# Patient Record
Sex: Male | Born: 1965 | Race: White | Hispanic: No | Marital: Single
Health system: Midwestern US, Community
[De-identification: ages and names within clinical notes are randomized; demographics above are authoritative.]

## PROBLEM LIST (undated history)

## (undated) DIAGNOSIS — G8918 Other acute postprocedural pain: Principal | ICD-10-CM

## (undated) DIAGNOSIS — T84018A Broken internal joint prosthesis, other site, initial encounter: Secondary | ICD-10-CM

## (undated) DIAGNOSIS — E119 Type 2 diabetes mellitus without complications: Secondary | ICD-10-CM

## (undated) DIAGNOSIS — G4730 Sleep apnea, unspecified: Secondary | ICD-10-CM

## (undated) DIAGNOSIS — F319 Bipolar disorder, unspecified: Secondary | ICD-10-CM

## (undated) DIAGNOSIS — G5600 Carpal tunnel syndrome, unspecified upper limb: Secondary | ICD-10-CM

## (undated) HISTORY — DX: Sleep apnea, unspecified: G47.30

## (undated) HISTORY — DX: Bipolar disorder, unspecified: F31.9

## (undated) HISTORY — DX: Carpal tunnel syndrome, unspecified upper limb: G56.00

## (undated) HISTORY — DX: Type 2 diabetes mellitus without complications: E11.9

---
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
## 2004-01-01 HISTORY — PX: SEPTOPLASTY: SUR1290

## 2011-06-29 DIAGNOSIS — F319 Bipolar disorder, unspecified: Secondary | ICD-10-CM | POA: Insufficient documentation

## 2012-09-09 DIAGNOSIS — E785 Hyperlipidemia, unspecified: Secondary | ICD-10-CM | POA: Insufficient documentation

## 2012-12-11 DIAGNOSIS — G8929 Other chronic pain: Secondary | ICD-10-CM

## 2012-12-11 DIAGNOSIS — M549 Dorsalgia, unspecified: Secondary | ICD-10-CM | POA: Insufficient documentation

## 2013-08-18 DIAGNOSIS — Z87442 Personal history of urinary calculi: Secondary | ICD-10-CM | POA: Insufficient documentation

## 2014-11-11 DIAGNOSIS — M47812 Spondylosis without myelopathy or radiculopathy, cervical region: Secondary | ICD-10-CM | POA: Insufficient documentation

## 2014-11-17 DIAGNOSIS — R768 Other specified abnormal immunological findings in serum: Secondary | ICD-10-CM | POA: Insufficient documentation

## 2014-11-30 DIAGNOSIS — M069 Rheumatoid arthritis, unspecified: Secondary | ICD-10-CM | POA: Insufficient documentation

## 2015-08-31 ENCOUNTER — Emergency Department (INDEPENDENT_AMBULATORY_CARE_PROVIDER_SITE_OTHER)
Admission: EM | Admit: 2015-08-31 | Discharge: 2015-08-31 | Disposition: A | Discharge status: Other Acute Inpatient Hospital | Payer: Medicare Other | Source: Home / Self Care | Attending: Family Medicine | Admitting: Family Medicine

## 2015-08-31 ENCOUNTER — Encounter (HOSPITAL_COMMUNITY): Payer: Self-pay | Admitting: *Deleted

## 2015-08-31 ENCOUNTER — Emergency Department (HOSPITAL_COMMUNITY)
Admission: EM | Admit: 2015-08-31 | Discharge: 2015-08-31 | Disposition: A | Discharge status: Home / Self Care | Payer: Medicare Other | Attending: Emergency Medicine | Admitting: Emergency Medicine

## 2015-08-31 ENCOUNTER — Emergency Department (HOSPITAL_COMMUNITY): Payer: Medicare Other

## 2015-08-31 DIAGNOSIS — R8299 Other abnormal findings in urine: Secondary | ICD-10-CM | POA: Diagnosis not present

## 2015-08-31 DIAGNOSIS — J029 Acute pharyngitis, unspecified: Secondary | ICD-10-CM | POA: Insufficient documentation

## 2015-08-31 DIAGNOSIS — R82998 Other abnormal findings in urine: Secondary | ICD-10-CM

## 2015-08-31 LAB — DIFFERENTIAL
Basophils Absolute: 0 10*3/uL (ref 0.0–0.1)
Basophils Relative: 0 % (ref 0–1)
EOS ABS: 0 10*3/uL (ref 0.0–0.7)
EOS PCT: 1 % (ref 0–5)
LYMPHS ABS: 1.6 10*3/uL (ref 0.7–4.0)
LYMPHS PCT: 27 % (ref 12–46)
MONOS PCT: 13 % — AB (ref 3–12)
Monocytes Absolute: 0.8 10*3/uL (ref 0.1–1.0)
Neutro Abs: 3.4 10*3/uL (ref 1.7–7.7)
Neutrophils Relative %: 59 % (ref 43–77)

## 2015-08-31 LAB — COMPREHENSIVE METABOLIC PANEL
ALBUMIN: 4.1 g/dL (ref 3.5–5.0)
ALT: 31 U/L (ref 17–63)
AST: 26 U/L (ref 15–41)
Alkaline Phosphatase: 67 U/L (ref 38–126)
Anion gap: 6 (ref 5–15)
BUN: 6 mg/dL (ref 6–20)
CHLORIDE: 105 mmol/L (ref 101–111)
CO2: 29 mmol/L (ref 22–32)
CREATININE: 0.75 mg/dL (ref 0.61–1.24)
Calcium: 9.1 mg/dL (ref 8.9–10.3)
GFR calc Af Amer: >60 mL/min (ref >60)
GLUCOSE: 94 mg/dL (ref 65–99)
POTASSIUM: 3.5 mmol/L (ref 3.5–5.1)
Sodium: 140 mmol/L (ref 135–145)
Total Bilirubin: 1 mg/dL (ref 0.3–1.2)
Total Protein: 7.3 g/dL (ref 6.5–8.1)

## 2015-08-31 LAB — POCT URINALYSIS DIP (DEVICE)
Glucose, UA: 100 mg/dL — AB
Hgb urine dipstick: NEGATIVE
KETONES UR: 15 mg/dL — AB
LEUKOCYTES UA: NEGATIVE
Nitrite: NEGATIVE
PH: 6 (ref 5.0–8.0)
Protein, ur: 30 mg/dL — AB
Specific Gravity, Urine: 1.025 (ref 1.005–1.030)
Urobilinogen, UA: 4 mg/dL — ABNORMAL HIGH (ref 0.0–1.0)

## 2015-08-31 LAB — URINALYSIS, ROUTINE W REFLEX MICROSCOPIC
BILIRUBIN URINE: NEGATIVE
GLUCOSE, UA: NEGATIVE mg/dL
Hgb urine dipstick: NEGATIVE
KETONES UR: NEGATIVE mg/dL
Leukocytes, UA: NEGATIVE
Nitrite: NEGATIVE
PH: 6 (ref 5.0–8.0)
Protein, ur: NEGATIVE mg/dL
SPECIFIC GRAVITY, URINE: 1.005 (ref 1.005–1.030)
Urobilinogen, UA: 1 mg/dL (ref 0.0–1.0)

## 2015-08-31 LAB — RAPID STREP SCREEN (MED CTR MEBANE ONLY): Streptococcus, Group A Screen (Direct): NEGATIVE

## 2015-08-31 LAB — CBC
HEMATOCRIT: 36.3 % — AB (ref 39.0–52.0)
Hemoglobin: 12.8 g/dL — ABNORMAL LOW (ref 13.0–17.0)
MCH: 32.9 pg (ref 26.0–34.0)
MCHC: 35.3 g/dL (ref 30.0–36.0)
MCV: 93.3 fL (ref 78.0–100.0)
PLATELETS: 170 10*3/uL (ref 150–400)
RBC: 3.89 MIL/uL — ABNORMAL LOW (ref 4.22–5.81)
RDW: 13.6 % (ref 11.5–15.5)
WBC: 6.2 10*3/uL (ref 4.0–10.5)

## 2015-08-31 LAB — LIPASE, BLOOD: LIPASE: 22 U/L (ref 22–51)

## 2015-08-31 NOTE — ED Notes (Signed)
Pt reports sore throat for several day. Pt went to [facility] and was sent here for eval of jaundice. Pt noted to have yellow tongue. Pt reports feeling tired and having dark colored urine.

## 2015-08-31 NOTE — Discharge Instructions (Signed)

## 2015-08-31 NOTE — ED Provider Notes (Signed)
CSN: [id number]     Arrival date & time 08/31/15  1507 History   First MD Initiated Contact with Patient 08/31/15 1523     Chief Complaint  Patient presents with  . Sore Throat   (Consider location/radiation/quality/duration/timing/severity/associated sxs/prior Treatment) Patient is a 49 y.o. male presenting with pharyngitis. The history is provided by the patient.  Sore Throat This is a new problem. The current episode started more than 2 days ago. The problem has been gradually worsening. Pertinent negatives include no chest pain and no abdominal pain.    History reviewed. No pertinent past medical history. History reviewed. No pertinent past surgical history. History reviewed. No pertinent family history. Social History  Substance Use Topics  . Smoking status: Never Smoker   . Smokeless tobacco: None  . Alcohol Use: Yes    Review of Systems  Constitutional: Positive for fever.  HENT: Positive for congestion, postnasal drip, rhinorrhea and sore throat.   Respiratory: Negative.   Cardiovascular: Negative.  Negative for chest pain.  Gastrointestinal: Negative.  Negative for abdominal pain.  Genitourinary: Positive for hematuria.    Allergies  Tegretol  Home Medications   Prior to Admission medications   Not on File   Meds Ordered and Administered this Visit  Medications - No data to display  There were no vitals taken for this visit. No data found.   Physical Exam  Constitutional: He is oriented to person, place, and time. He appears well-developed and well-nourished. No distress.  HENT:  Right Ear: External ear normal.  Left Ear: External ear normal.  Mouth/Throat: Oropharynx is clear and moist.  Eyes: Conjunctivae are normal. Pupils are equal, round, and reactive to light.  Neck: Normal range of motion. Neck supple.  Cardiovascular: Normal heart sounds.   Pulmonary/Chest: Effort normal and breath sounds normal.  Abdominal: Soft. Bowel sounds are normal. He  exhibits no mass. There is no tenderness.  Lymphadenopathy:    He has no cervical adenopathy.  Neurological: He is alert and oriented to person, place, and time.  Skin: Skin is warm and dry.  Nursing note and vitals reviewed.   ED Course  Procedures (including critical care time)  Labs Review Labs Reviewed  POCT URINALYSIS DIP (DEVICE) - Abnormal; Notable for the following:    Glucose, UA 100 (*)    Bilirubin Urine SMALL (*)    Ketones, ur 15 (*)    Protein, ur 30 (*)    Urobilinogen, UA 4.0 (*)    All other components within normal limits   U/a abnl. Imaging Review No results found.   Visual Acuity Review  Right Eye Distance:   Left Eye Distance:   Bilateral Distance:    Right Eye Near:   Left Eye Near:    Bilateral Near:         MDM   1. Dark urine    Sent for eval of abnl u/a.   Linna Hoff, MD 08/31/15 [phone]

## 2015-08-31 NOTE — ED Notes (Signed)
Pt  Reports  Symptoms  Of  sorethroat    X  4  Days  With  Dark  Urine      Pt  Reports          Decreased  Appetite   And         Body  Aches

## 2015-08-31 NOTE — ED Provider Notes (Signed)
5:36 PM BP 145/78 mmHg  Pulse 77  Temp(Src) 98.5 F (36.9 C) (Oral)  Resp 16  Ht 5\' 8"  (1.727 m)  Wt 254 lb (115.214 kg)  BMI 38.63 kg/m2  SpO2 98%  S- patient sent from urgent care for sxs of uri/dark urine this morning. C/o sore throat, decreased hearing. Cough for several days. Subjective fevers at home. No other meds including topical anesthetics.   O- well appearing male in nad HEENT- atraumatic Ears:\ R TM with retraction, no =erythema or tenderness, Left tm normal Mouth:-coated tongue with yellow discoloration (just drank fruit juice) single vesicle proximal to the uvula, no exudate. BL cervical lymphadenopathy, no cyanosis Eyes: no scleral icterus Heart: RRR, no mgr Chest: ctab Skin: warm and dry, no jaundice  A: likely viral uri, UA pos for urobilinogen, no signs of jaundice or cyanosis indicating methemoglobinemia P: continue with work up, no other LABS INDICATED    , PA-C 08/31/15 1749  09/02/15, MD 09/02/15 11/02/15

## 2015-08-31 NOTE — ED Notes (Signed)
Patient transported to X-ray 

## 2015-08-31 NOTE — ED Provider Notes (Signed)
CSN: [id number]     Arrival date & time 08/31/15  1714 History   First MD Initiated Contact with Patient 08/31/15 1932     Chief Complaint  Patient presents with  . Sore Throat  . Jaundice     (Consider location/radiation/quality/duration/timing/severity/associated sxs/prior Treatment) Patient is a 49 y.o. male presenting with URI.  URI Presenting symptoms: congestion, rhinorrhea and sore throat   Presenting symptoms: no cough, no ear pain, no facial pain and no fever   Severity:  Severe Onset quality:  Gradual Duration:  5 days Timing:  Constant Progression:  Worsening Chronicity:  New Relieved by:  None tried Worsened by:  Drinking Ineffective treatments:  None tried Associated symptoms: swollen glands   Associated symptoms: no arthralgias, no headaches, no myalgias, no neck pain and no wheezing   Risk factors: no immunosuppression and no sick contacts     History reviewed. No pertinent past medical history. Past Surgical History  Procedure Laterality Date  . Tonsillectomy     No family history on file. Social History  Substance Use Topics  . Smoking status: Never Smoker   . Smokeless tobacco: None  . Alcohol Use: Yes    Review of Systems  Constitutional: Negative for fever and chills.  HENT: Positive for congestion, rhinorrhea, sinus pressure and sore throat. Negative for ear pain.   Eyes: Negative for visual disturbance.  Respiratory: Negative for cough, shortness of breath and wheezing.   Cardiovascular: Negative for chest pain.  Gastrointestinal: Negative for nausea, vomiting, abdominal pain, diarrhea and constipation.  Genitourinary: Negative for dysuria and difficulty urinating.  Musculoskeletal: Negative for myalgias, arthralgias and neck pain.  Skin: Negative for wound.  Neurological: Negative for syncope and headaches.  Psychiatric/Behavioral: Negative for behavioral problems.  All other systems reviewed and are negative.     Allergies   Tegretol  Home Medications   Prior to Admission medications   Not on File   BP 145/78 mmHg  Pulse 77  Temp(Src) 98.5 F (36.9 C) (Oral)  Resp 16  Ht 5\' 8"  (1.727 m)  Wt 254 lb (115.214 kg)  BMI 38.63 kg/m2  SpO2 98% Physical Exam  Constitutional: He is oriented to person, place, and time. He appears well-developed and well-nourished.  HENT:  Head: Normocephalic and atraumatic.  Mouth/Throat: Oral lesions present. No uvula swelling. Posterior oropharyngeal erythema present. No oropharyngeal exudate or tonsillar abscesses.    Eyes: EOM are normal.  Neck: Normal range of motion.  Cardiovascular: Normal rate, regular rhythm and normal heart sounds.   No murmur heard. Pulmonary/Chest: Effort normal and breath sounds normal. No respiratory distress.  Abdominal: Soft. There is no tenderness.  Musculoskeletal: He exhibits no edema.  Neurological: He is alert and oriented to person, place, and time.  Skin: No rash noted. He is not diaphoretic.    ED Course  Procedures (including critical care time) Labs Review Labs Reviewed  CBC - Abnormal; Notable for the following:    RBC 3.89 (*)    Hemoglobin 12.8 (*)    HCT 36.3 (*)    All other components within normal limits  DIFFERENTIAL - Abnormal; Notable for the following:    Monocytes Relative 13 (*)    All other components within normal limits  RAPID STREP SCREEN (NOT AT [facility])  CULTURE, GROUP A STREP  LIPASE, BLOOD  COMPREHENSIVE METABOLIC PANEL  URINALYSIS, ROUTINE W REFLEX MICROSCOPIC (NOT AT [facility])  RPR  CBC WITH DIFFERENTIAL/PLATELET  HIV ANTIBODY (ROUTINE TESTING)  GC/CHLAMYDIA PROBE AMP ([place]) NOT  AT [facility]    Imaging Review Dg Neck Soft Tissue  08/31/2015   CLINICAL DATA:  Dysphagia, 2 days duration.  EXAM: NECK SOFT TISSUES - 1+ VIEW  COMPARISON:  None.  FINDINGS: There is no evidence of retropharyngeal soft tissue swelling or epiglottic enlargement. The cervical airway is unremarkable and no radio-opaque  foreign body identified.  IMPRESSION: Negative.   Electronically Signed   By: Ellery Plunk M.D.   On: 08/31/2015 21:11   I have personally reviewed and evaluated these images and lab results as part of my medical decision-making.   EKG Interpretation None      MDM   Final diagnoses:  Pharyngitis     Patient is a 49 year old male with a history of diabetes that presents with a fibrinous sore throat. Patient was seen in urgent care and there was also concern for jaundice on physical exam however patient denies any right upper quadrant pain. On arrival to the ED today patient is afebrile with stable vital signs. Patient states he has felt hot but has not had any objective fevers. On exam patient has a small ulceration to the soft palate and an erythematous posterior oropharynx. Patient had a tonsillectomy. Uvula is midline. Patient is homosexual and his partner is HIV positive however the patient does not know his status at this time. Patient wishes to be tested. Screening labs were sent and patient has normal LFTs with no elevated bilirubin. Patient's urine was within normal limits. We will send a throat culture, strep screen, GC chlamydia, RPR, HIV test. Patient is able to handle oral secretions and has no airway difficult. Patient does admit to receptive oral sex. Patient has no tenderness in his right upper quadrant.  Patient's strep screen is negative. Patient's soft tissue neck is negative. We'll have the patient follow-up as an outpatient and return precautions given.  Beverely Risen, MD 08/31/15 2150  Richardean Canal, MD 09/01/15 2034

## 2015-09-01 LAB — GC/CHLAMYDIA PROBE AMP (~~LOC~~) NOT AT ARMC
Chlamydia: NEGATIVE
Neisseria Gonorrhea: NEGATIVE

## 2015-09-01 LAB — HIV ANTIBODY (ROUTINE TESTING W REFLEX): HIV SCREEN 4TH GENERATION: NONREACTIVE

## 2015-09-01 LAB — RPR: RPR: NONREACTIVE

## 2015-09-02 LAB — CULTURE, GROUP A STREP: Strep A Culture: NEGATIVE

## 2015-09-09 ENCOUNTER — Emergency Department (INDEPENDENT_AMBULATORY_CARE_PROVIDER_SITE_OTHER)
Admission: EM | Admit: 2015-09-09 | Discharge: 2015-09-09 | Disposition: A | Discharge status: Home / Self Care | Payer: Medicare Other | Source: Home / Self Care | Attending: Emergency Medicine | Admitting: Emergency Medicine

## 2015-09-09 ENCOUNTER — Encounter (HOSPITAL_COMMUNITY): Payer: Self-pay | Admitting: Emergency Medicine

## 2015-09-09 DIAGNOSIS — J0140 Acute pansinusitis, unspecified: Secondary | ICD-10-CM

## 2015-09-09 MED ORDER — AZITHROMYCIN 250 MG PO TABS: ORAL_TABLET | ORAL | Status: DC

## 2015-09-09 MED ORDER — PREDNISONE 50 MG PO TABS: ORAL_TABLET | ORAL | Status: DC

## 2015-09-09 NOTE — ED Notes (Addendum)
Patient reports sore throat continues since being seen 8/31.  Seen at [facility] and then transferred to [facility] emergency department.  Patient did have a negative strep when worked up in emergency department on 8/31.  Patient states he is convinced he has a sinus infection: c/o cough, stuffy in head, right ear pain, stuffy hearing

## 2015-09-09 NOTE — ED Provider Notes (Signed)
CSN: [id number]     Arrival date & time 09/09/15  1906 History   First MD Initiated Contact with Patient 09/09/15 1943     Chief Complaint  Patient presents with  . Sore Throat   (Consider location/radiation/quality/duration/timing/severity/associated sxs/prior Treatment) HPI  He is a 49 year old man here for evaluation of sore throat. He was seen here for this on August 31. At that time he had a negative strep test. He was transferred to the emergency room for urine abnormalities. He states his symptoms have been going on for 2-1/2 weeks now he reports nasal congestion, sinus pressure, postnasal drip, sore throat, and cough. He denies any fevers. He does report ear pain, worse on the right. He states this feels like prior sinus infections he has had.  History reviewed. No pertinent past medical history. Past Surgical History  Procedure Laterality Date  . Tonsillectomy    . Nasal septum surgery     No family history on file. Social History  Substance Use Topics  . Smoking status: Never Smoker   . Smokeless tobacco: None  . Alcohol Use: Yes    Review of Systems As in history of present illness Allergies  Tegretol  Home Medications   Prior to Admission medications   Medication Sig Start Date End Date Taking? Authorizing Provider  azithromycin (ZITHROMAX Z-PAK) 250 MG tablet Take 2 pills today, then 1 pill daily until gone. 09/09/15   Charm Rings, MD  predniSONE (DELTASONE) 50 MG tablet Take 1 pill daily for 5 days. 09/09/15   Charm Rings, MD   Meds Ordered and Administered this Visit  Medications - No data to display  BP 159/71 mmHg  Pulse 74  Temp(Src) 98.3 F (36.8 C) (Oral)  Resp 17  SpO2 100% No data found.   Physical Exam  Constitutional: He is oriented to person, place, and time. He appears well-developed and well-nourished. No distress.  HENT:  Mouth/Throat: No oropharyngeal exudate.  Nasal discharge present. He has a healing ulcer on the roof of his mouth.  Right TM is mildly retracted.  Eyes: Conjunctivae are normal.  Neck: Neck supple.  Cardiovascular: Normal rate, regular rhythm and normal heart sounds.   No murmur heard. Pulmonary/Chest: Effort normal and breath sounds normal. No respiratory distress. He has no wheezes. He has no rales.  Lymphadenopathy:    He has no cervical adenopathy.  Neurological: He is alert and oriented to person, place, and time.    ED Course  Procedures (including critical care time)  Labs Review Labs Reviewed - No data to display  Imaging Review No results found.   MDM   1. Acute pansinusitis, recurrence not specified    Treatment with azithromycin and prednisone. He will continue his allergy medications. Follow-up as needed.     Charm Rings, MD 09/09/15 2018

## 2015-09-09 NOTE — Discharge Instructions (Signed)
You have a sinus infection. Please continue your allergy medicines. Take azithromycin and prednisone as prescribed. Follow-up as needed.

## 2015-10-24 ENCOUNTER — Encounter: Payer: Self-pay | Admitting: Infectious Diseases

## 2015-10-24 ENCOUNTER — Ambulatory Visit (INDEPENDENT_AMBULATORY_CARE_PROVIDER_SITE_OTHER): Payer: Medicare Other | Admitting: Infectious Diseases

## 2015-10-24 ENCOUNTER — Other Ambulatory Visit (HOSPITAL_COMMUNITY)
Admission: RE | Admit: 2015-10-24 | Discharge: 2015-10-24 | Disposition: A | Discharge status: Home / Self Care | Payer: Medicare Other | Source: Ambulatory Visit | Attending: Infectious Diseases | Admitting: Infectious Diseases

## 2015-10-24 VITALS — BP 144/94 | HR 69 | Temp 98.0°F | Ht 68.0 in | Wt 244.0 lb

## 2015-10-24 DIAGNOSIS — Z7253 High risk bisexual behavior: Secondary | ICD-10-CM

## 2015-10-24 DIAGNOSIS — Z113 Encounter for screening for infections with a predominantly sexual mode of transmission: Secondary | ICD-10-CM | POA: Diagnosis present

## 2015-10-24 DIAGNOSIS — Z7252 High risk homosexual behavior: Secondary | ICD-10-CM | POA: Diagnosis not present

## 2015-10-24 DIAGNOSIS — Z206 Contact with and (suspected) exposure to human immunodeficiency virus [HIV]: Secondary | ICD-10-CM | POA: Diagnosis not present

## 2015-10-24 MED ORDER — EMTRICITABINE-TENOFOVIR DF 200-300 MG PO TABS: 1.0000 | ORAL_TABLET | Freq: Every day | ORAL | Status: DC

## 2015-10-24 NOTE — Assessment & Plan Note (Addendum)
I spoke with him about starting PrEP, he would like to start now. I asked if he was interested in clinical trial and he wishes to start on medicine sooner, rather than later. I also discussed his case with PrEP trial team.  Will check his labs today (STD, HIV RNA) and see him back in 3 month to repeat.  He will given a rx to start after his labs return.  He has gotten flu shot.  Consider hep A at f/u if negative.

## 2015-10-24 NOTE — Progress Notes (Signed)
   Subjective:    Patient ID: Julian Hull, male    DOB: Jul 25, 1966, 49 y.o.   MRN: [id number]  HPI 49 yo M with hx of bipolar, carpal tunnel in L hand, had surgery ~ 3 months ago. Also has difficulty with his R hand.  Here for eval for PrEP.   Has had 4 partners in last 6 months. No hx of STIs.   PMhx/SOC/FHx reviewed, updated in EPIC.   Is trying to eat less,   Review of Systems  Constitutional: Negative for fever, chills, appetite change and unexpected weight change.  Respiratory: Negative for cough and shortness of breath.   Cardiovascular: Negative for chest pain.  Gastrointestinal: Negative for diarrhea and constipation.  Genitourinary: Negative for difficulty urinating.  Psychiatric/Behavioral: Negative for dysphoric mood.  FSG was 144 last PM  Please see HPI. 12 point ROS o/w (-)     Objective:   Physical Exam  Constitutional: He appears well-developed and well-nourished.  HENT:  Mouth/Throat: No oropharyngeal exudate.  Eyes: EOM are normal. Pupils are equal, round, and reactive to light.  Neck: Neck supple.  Cardiovascular: Normal rate, regular rhythm and normal heart sounds.   Pulmonary/Chest: Effort normal and breath sounds normal.  Abdominal: Soft. Bowel sounds are normal. There is no tenderness.  Musculoskeletal: He exhibits no edema.  Lymphadenopathy:    He has no cervical adenopathy.      Assessment & Plan:

## 2015-10-25 LAB — COMPREHENSIVE METABOLIC PANEL

## 2015-10-25 LAB — HEPATITIS B SURFACE ANTIBODY,QUALITATIVE

## 2015-10-25 LAB — RPR

## 2015-10-25 LAB — URINE CYTOLOGY ANCILLARY ONLY
Chlamydia: NEGATIVE
Neisseria Gonorrhea: NEGATIVE

## 2015-10-25 LAB — HIV ANTIBODY (ROUTINE TESTING W REFLEX)

## 2015-10-25 LAB — CBC

## 2015-10-25 LAB — HEPATITIS C ANTIBODY

## 2015-10-25 LAB — HEPATITIS A ANTIBODY, TOTAL

## 2015-10-25 LAB — HEPATITIS B SURFACE ANTIGEN

## 2015-10-26 ENCOUNTER — Other Ambulatory Visit: Payer: Medicare Other

## 2015-10-26 LAB — CBC
HCT: 40.1 % (ref 39.0–52.0)
Hemoglobin: 14.5 g/dL (ref 13.0–17.0)
MCH: 33.7 pg (ref 26.0–34.0)
MCHC: 36.2 g/dL — ABNORMAL HIGH (ref 30.0–36.0)
MCV: 93.3 fL (ref 78.0–100.0)
MPV: 9.8 fL (ref 8.6–12.4)
PLATELETS: 192 10*3/uL (ref 150–400)
RBC: 4.3 MIL/uL (ref 4.22–5.81)
RDW: 14.5 % (ref 11.5–15.5)
WBC: 6.6 10*3/uL (ref 4.0–10.5)

## 2015-10-26 LAB — COMPREHENSIVE METABOLIC PANEL WITH GFR
ALT: 40 U/L (ref 9–46)
AST: 22 U/L (ref 10–40)
Albumin: 4.2 g/dL (ref 3.6–5.1)
Alkaline Phosphatase: 81 U/L (ref 40–115)
BUN: 11 mg/dL (ref 7–25)
CO2: 28 mmol/L (ref 20–31)
Calcium: 9.5 mg/dL (ref 8.6–10.3)
Chloride: 102 mmol/L (ref 98–110)
Creat: 0.65 mg/dL (ref 0.60–1.35)
Glucose, Bld: 110 mg/dL — ABNORMAL HIGH (ref 65–99)
Potassium: 4.1 mmol/L (ref 3.5–5.3)
Sodium: 141 mmol/L (ref 135–146)
Total Bilirubin: 0.6 mg/dL (ref 0.2–1.2)
Total Protein: 6.9 g/dL (ref 6.1–8.1)

## 2015-10-26 LAB — HIV-1 RNA QUANT-NO REFLEX-BLD: HIV-1 RNA Quant, Log: <1.3 Log copies/mL (ref <1.30)

## 2015-10-26 NOTE — Addendum Note (Signed)
Addended by: Andree Coss on: 10/26/2015 04:44 PM   Modules accepted: Orders

## 2015-10-27 LAB — HEPATITIS B SURFACE ANTIBODY,QUALITATIVE: Hep B S Ab: POSITIVE — AB

## 2015-10-27 LAB — HIV ANTIBODY (ROUTINE TESTING W REFLEX): HIV: NONREACTIVE

## 2015-10-27 LAB — HEPATITIS B SURFACE ANTIGEN: HEP B S AG: NEGATIVE

## 2015-10-27 LAB — HEPATITIS C ANTIBODY: HCV Ab: NEGATIVE

## 2015-10-27 LAB — RPR

## 2015-10-27 LAB — HEPATITIS A ANTIBODY, TOTAL: Hep A Total Ab: NONREACTIVE

## 2015-10-28 ENCOUNTER — Other Ambulatory Visit: Payer: Self-pay | Admitting: *Deleted

## 2015-10-28 DIAGNOSIS — Z206 Contact with and (suspected) exposure to human immunodeficiency virus [HIV]: Secondary | ICD-10-CM

## 2015-10-28 MED ORDER — EMTRICITABINE-TENOFOVIR DF 200-300 MG PO TABS: 1.0000 | ORAL_TABLET | Freq: Every day | ORAL | Status: AC

## 2015-10-31 ENCOUNTER — Ambulatory Visit (INDEPENDENT_AMBULATORY_CARE_PROVIDER_SITE_OTHER): Payer: Medicare Other | Admitting: Family Medicine

## 2015-10-31 ENCOUNTER — Encounter: Payer: Self-pay | Admitting: Family Medicine

## 2015-10-31 VITALS — BP 119/85 | HR 82 | Temp 98.2°F | Ht 68.0 in | Wt 243.5 lb

## 2015-10-31 DIAGNOSIS — J45909 Unspecified asthma, uncomplicated: Secondary | ICD-10-CM | POA: Insufficient documentation

## 2015-10-31 DIAGNOSIS — F3170 Bipolar disorder, currently in remission, most recent episode unspecified: Secondary | ICD-10-CM

## 2015-10-31 DIAGNOSIS — E041 Nontoxic single thyroid nodule: Secondary | ICD-10-CM

## 2015-10-31 DIAGNOSIS — R7303 Prediabetes: Secondary | ICD-10-CM

## 2015-10-31 DIAGNOSIS — E669 Obesity, unspecified: Secondary | ICD-10-CM

## 2015-10-31 DIAGNOSIS — G5603 Carpal tunnel syndrome, bilateral upper limbs: Secondary | ICD-10-CM

## 2015-10-31 DIAGNOSIS — Z7689 Persons encountering health services in other specified circumstances: Secondary | ICD-10-CM

## 2015-10-31 DIAGNOSIS — Z8639 Personal history of other endocrine, nutritional and metabolic disease: Secondary | ICD-10-CM

## 2015-10-31 DIAGNOSIS — J4540 Moderate persistent asthma, uncomplicated: Secondary | ICD-10-CM | POA: Diagnosis not present

## 2015-10-31 DIAGNOSIS — G4733 Obstructive sleep apnea (adult) (pediatric): Secondary | ICD-10-CM

## 2015-10-31 DIAGNOSIS — G5600 Carpal tunnel syndrome, unspecified upper limb: Secondary | ICD-10-CM | POA: Insufficient documentation

## 2015-10-31 DIAGNOSIS — Z7189 Other specified counseling: Secondary | ICD-10-CM | POA: Diagnosis not present

## 2015-10-31 DIAGNOSIS — F319 Bipolar disorder, unspecified: Secondary | ICD-10-CM | POA: Insufficient documentation

## 2015-10-31 LAB — T4, FREE: FREE T4: 1 ng/dL (ref 0.80–1.80)

## 2015-10-31 LAB — POCT GLYCOSYLATED HEMOGLOBIN (HGB A1C): HEMOGLOBIN A1C: 5.2

## 2015-10-31 LAB — LIPID PANEL
CHOL/HDL RATIO: 6.6 ratio — AB (ref <=5.0)
Cholesterol: 172 mg/dL (ref 125–200)
HDL: 26 mg/dL — ABNORMAL LOW (ref >=40)
LDL Cholesterol: 95 mg/dL (ref <130)
Triglycerides: 254 mg/dL — ABNORMAL HIGH (ref <150)
VLDL: 51 mg/dL — AB (ref <30)

## 2015-10-31 LAB — TSH: TSH: 0.682 u[IU]/mL (ref 0.350–4.500)

## 2015-10-31 MED ORDER — CETIRIZINE HCL 10 MG PO TABS: 10.0000 mg | ORAL_TABLET | Freq: Every day | ORAL | Status: AC

## 2015-10-31 MED ORDER — BUDESONIDE-FORMOTEROL FUMARATE 160-4.5 MCG/ACT IN AERO: 2.0000 | INHALATION_SPRAY | Freq: Every day | RESPIRATORY_TRACT | Status: AC

## 2015-10-31 MED ORDER — FLUTICASONE PROPIONATE 50 MCG/ACT NA SUSP: 2.0000 | Freq: Every day | NASAL | Status: AC

## 2015-10-31 NOTE — Assessment & Plan Note (Signed)
s/p release on left in April and no symptoms on right (though this sounds like it was also diagnosed). Will get records.

## 2015-10-31 NOTE — Assessment & Plan Note (Signed)
Seen at [facility] who prescribes medications.

## 2015-10-31 NOTE — Assessment & Plan Note (Signed)
Stable despite consistent noncompliance with medications. Refilled these today.

## 2015-10-31 NOTE — Assessment & Plan Note (Signed)
Discussed this at length and the need for this to be addressed to prevent significant health problems.
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
## 2015-10-31 NOTE — Patient Instructions (Signed)
Thank you for coming in today!  We are checking some labs today, and we'll call you if they are abnormal. If you do not hear from me by phone or letter in 2 weeks, please call us as we may have been unable to reach you.   Our clinic's number is [phone]. Feel free to call any time with questions or concerns. We will answer any questions after hours with our 24-hour emergency line at that number as well.   - Dr. Honestii Marton  

## 2015-10-31 NOTE — Assessment & Plan Note (Signed)
Check TSH, T4; no palpable nodules on exam today. Will decide on further work up pending medical records from previous provider. Discussed the possible need for FNA.

## 2015-10-31 NOTE — Assessment & Plan Note (Signed)
Will get medical records and check A1c today. Reviewed carbohydrate identification and avoidance today in addition to reinforcing the need for weight loss to treat his many related conditions.

## 2015-11-29 ENCOUNTER — Encounter: Payer: Self-pay | Admitting: Family Medicine

## 2015-11-29 DIAGNOSIS — E291 Testicular hypofunction: Secondary | ICD-10-CM | POA: Insufficient documentation

## 2015-11-29 DIAGNOSIS — K219 Gastro-esophageal reflux disease without esophagitis: Secondary | ICD-10-CM | POA: Insufficient documentation

## 2016-01-25 ENCOUNTER — Ambulatory Visit: Payer: Medicare Other | Admitting: Infectious Diseases

## 2016-02-05 ENCOUNTER — Other Ambulatory Visit: Payer: Self-pay | Admitting: Infectious Diseases

## 2016-12-29 IMAGING — CR DG NECK SOFT TISSUE
1 series · 1 of 1 positions shown · non-contrast
Comparison: None.

CLINICAL DATA: Dysphagia, 2 days duration.

EXAM:
NECK SOFT TISSUES - 1+ VIEW

[neck lat]
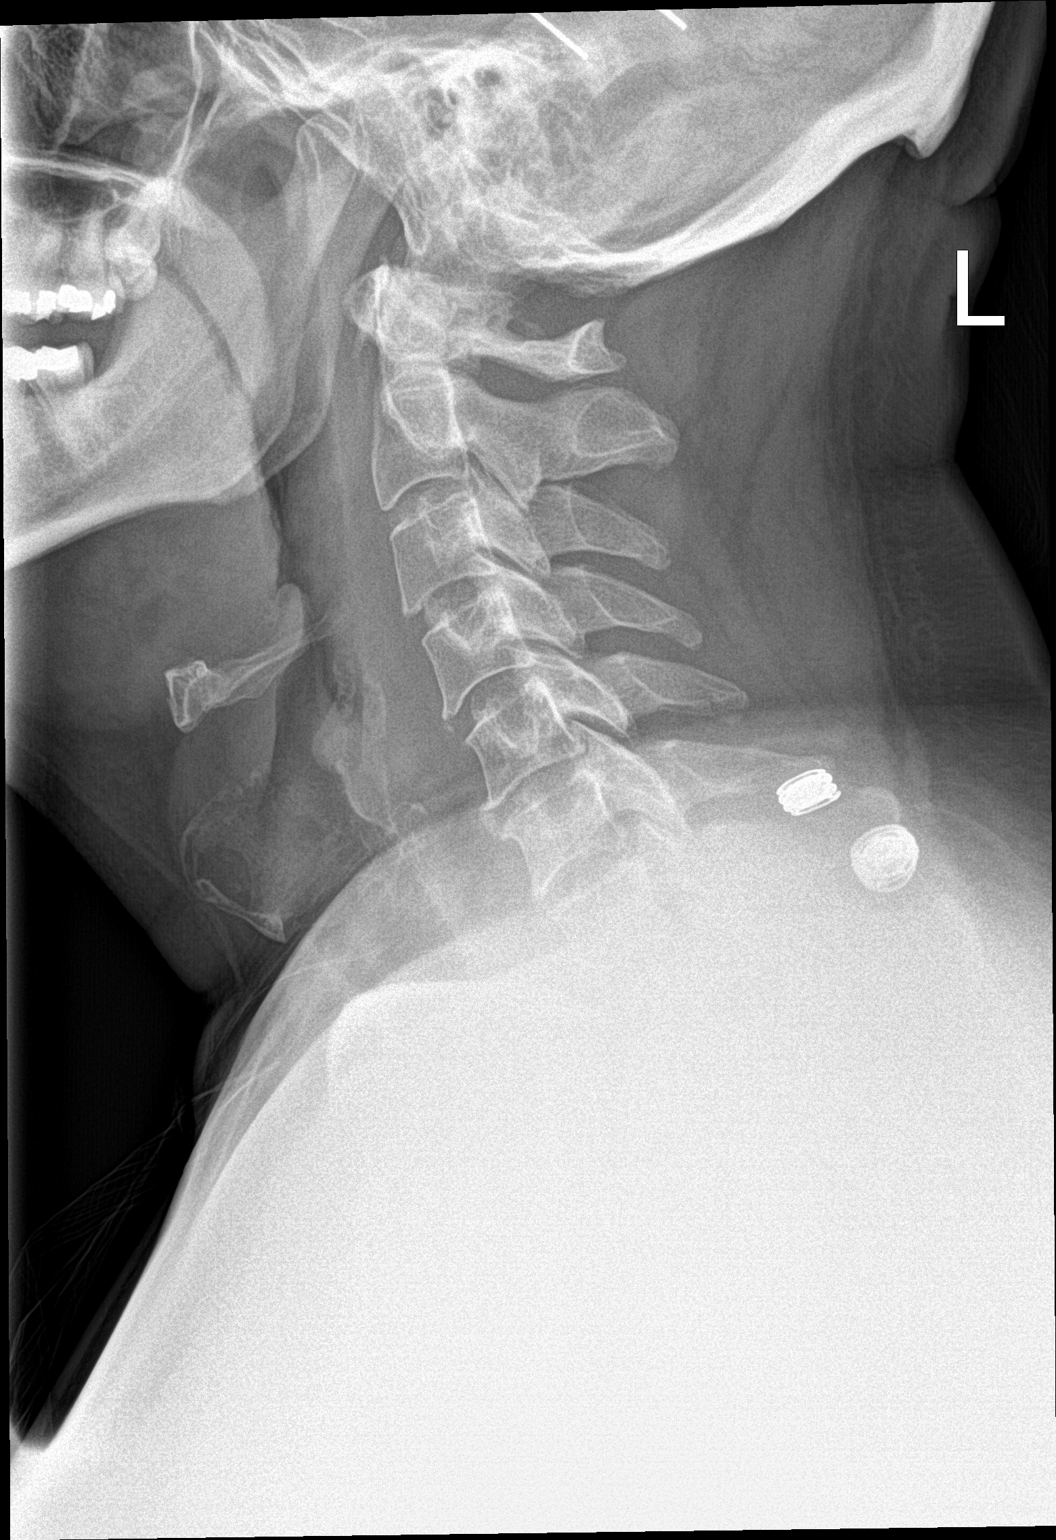

[1 of 1 positions shown; findings below may reference images not displayed]

FINDINGS: There is no evidence of retropharyngeal soft tissue swelling or
epiglottic enlargement. The cervical airway is unremarkable and no
radio-opaque foreign body identified.
IMPRESSION: Negative.

## 2018-09-23 ENCOUNTER — Telehealth: Payer: Self-pay | Admitting: Student in an Organized Health Care Education/Training Program

## 2018-09-23 NOTE — Telephone Encounter (Signed)
Attempted to call pt to schedule appt with Dr. Mosetta Putt, phone number was not working at the time.

## 2022-06-05 ENCOUNTER — Encounter: Payer: Self-pay | Admitting: *Deleted

## 2023-03-05 ENCOUNTER — Emergency Department: Admit: 2023-03-06 | Payer: MEDICAID | Primary: Internal Medicine

## 2023-03-05 ENCOUNTER — Inpatient Hospital Stay
Admit: 2023-03-05 | Discharge: 2023-03-06 | Disposition: A | Discharge status: Home / Self Care | Payer: MEDICAID | Attending: Emergency Medicine

## 2023-03-05 DIAGNOSIS — S0990XA Unspecified injury of head, initial encounter: Secondary | ICD-10-CM

## 2023-03-05 DIAGNOSIS — W19XXXA Unspecified fall, initial encounter: Secondary | ICD-10-CM

## 2023-03-05 NOTE — ED Provider Notes (Signed)
Emergency Department     Faculty Attestation    I performed a history and physical examination of the patient and discussed management with the mid level provider. I reviewed the mid level provider's note and agree with the documented findings and plan of care. Any areas of disagreement are noted on the chart. I was personally present for the key portions of any procedures. I have documented in the chart those procedures where I was not present during the key portions. I have reviewed the emergency nurses triage note. I agree with the chief complaint, past medical history, past surgical history, allergies, medications, social and family history as documented unless otherwise noted below. Documentation of the HPI, Physical Exam and Medical Decision Making performed by medical students or scribes is based on my personal performance of the HPI, PE and MDM. For Physician Assistant/ Nurse Practitioner cases/documentation I have personally evaluated this patient and have completed at least one if not all key elements of the E/M (history, physical exam, and MDM). Additional findings are as noted.      Primary Care Physician:  Victorino December, MD    CHIEF COMPLAINT       Chief Complaint   Patient presents with    [place] backwards onto a steel beam, hit the back of his head and neck while at work. Drove self here. No thinners, no vision changes.        RECENT VITALS:   Temp: 98.2 F (36.8 C),  Pulse: 73, Respirations: 16, BP: 119/67    LABS:  Labs Reviewed - No data to display      PERTINENT ATTENDING PHYSICIAN COMMENTS:    Patient here with a fall at work he fell over some boxes and struck his head at the neck and upper back on a metal beam there is no loss of consciousness but he was dazed there is no numbness tingling or paresthesias she denies being on any anticoagulation         Lear Ng, MD  03/05/23 1909

## 2023-03-05 NOTE — Discharge Instructions (Signed)
Take your medication as indicated and prescribed.  For pain use acetaminophen (Tylenol).  You can take over the counter acetaminophen tablets (1 - 2 tablets of the 500-mg strength every 6 hours).  Do not take any medication that contains aspirin / ibuprofen or blood thinning properties until seen by your physician.  Do not do any sporting activity (running, playing basketball / football, etc) until you are seen by your physician.    For persistent headache you can try sitting in a cool, dark room and try taking 1 - 2 tabs (25 - 50 mg) of diphenhydramine (Benadryl) to help you relax.    Please follow up with your primary care provider regarding your thyroid nodule which you should have a follow up ultrasound for.     PLEASE RETURN TO THE EMERGENCY DEPARTMENT IMMEDIATELY for worsening symptoms, persistent headache, change in vision / hearing / taste, ringing in your ears, sleeping more than normal, or if you develop any concerning symptoms such as: high fever not relieved by acetaminophen (Tylenol) and/or ibuprofen (Motrin / Advil), chills, shortness of breath, chest pain, feeling of your heart fluttering or racing, persistent nausea and/or vomiting, vomiting up blood, blood in your stool, loss of consciousness, numbness, weakness or tingling in the arms or legs or change in color of the extremities, changes in mental status, blurry vision, loss of bladder / bowel control, unable to follow up with your physician, or other any other care or concern.

## 2023-03-05 NOTE — ED Provider Notes (Incomplete)
[facility] EMERGENCY DEPARTMENT  Emergency Department Encounter  Mid Level Provider     Pt Name: Daniel Hensley  MRN: [id number]  Birthdate September 02, 1966  Date of evaluation: 03/05/23  PCP:  No primary care provider on file.    CHIEF COMPLAINT       Chief Complaint   Patient presents with   . Fall     Fell backwards onto a steel beam, hit the back of his head and neck while at work. Drove self here. No thinners, no vision changes.        HISTORY OF PRESENT ILLNESS  (Location/Symptom, Timing/Onset,Context/Setting, Quality, Duration, Modifying Factors, Severity.)      Daniel Hensley is a 57 y.o. male who presents with complaints of a fall while at work.  He works for [employer] and tripped backwards over a few boxes and then struck his lower neck/upper thoracic spine on a steel beam subsequently struck his head against the beam as well he denied any loss of consciousness.  He has a very mild pain on his head and some pinpoint tenderness midline on the thoracic spine.  Range of motion of the neck is normal and without tenderness.  He has no neurological deficits.  No hemotympanum.  Extraocular movement of the eyes is normal bilaterally.  Gaze is normal.  Patient is answering questions appropriately moving all extremities without complication    PAST MEDICAL /SURGICAL / SOCIAL / FAMILY HISTORY      has a past medical history of Arthritis, Bipolar 1 disorder (HCC), Diabetes mellitus (HCC), Hyperlipidemia, Hypertension, and OSA (obstructive sleep apnea).     has no past surgical history on file.    Social History     Socioeconomic History   . Marital status: Not on file     Spouse name: Not on file   . Number of children: Not on file   . Years of education: Not on file   . Highest education level: Not on file   Occupational History   . Not on file   Tobacco Use   . Smoking status: Never   . Smokeless tobacco: Never   Substance and Sexual Activity   . Alcohol use: Not Currently   . Drug use: Never   . Sexual  activity: Not on file   Other Topics Concern   . Not on file   Social History Narrative   . Not on file     Social Determinants of Health     Financial Resource Strain: Not on file   Food Insecurity: Not on file   Transportation Needs: Not on file   Physical Activity: Not on file   Stress: Not on file   Social Connections: Not on file   Intimate Partner Violence: Not on file   Housing Stability: Not on file       History reviewed. No pertinent family history.    :  Vraylar [cariprazine]    Home Medications:  Prior to Admission medications    Medication Sig Start Date End Date Taking? Authorizing Provider   Semaglutide,0.25 or 0.5MG /DOS, (OZEMPIC, 0.25 OR 0.5 MG/DOSE,) 2 MG/3ML SOPN Inject 0.5 mg into the skin once a week 02/22/23  Yes [provider]   meloxicam (MOBIC) 15 MG tablet Take 1 tablet by mouth daily 02/21/23  Yes [provider]   hydrOXYzine HCl (ATARAX) 10 MG tablet Take 1 tablet by mouth every 8 hours as needed 02/06/23  Yes [provider]   lisinopril (  PRINIVIL;ZESTRIL) 40 MG tablet Take 1 tablet by mouth daily 02/06/23  Yes [provider]   emtricitabine-tenofovir (TRUVADA) 200-300 MG per tablet Take 1 tablet by mouth daily 04/09/22  Yes [provider]   dapagliflozin (FARXIGA) 10 MG tablet Take 1 tablet by mouth daily 05/15/22  Yes [provider]   atorvastatin (LIPITOR) 40 MG tablet Take 1 tablet by mouth daily 02/06/23  Yes [provider]   Asenapine (SECUADO) 5.7 MG/24HR PT24 Apply 1 patch topically daily 01/26/22  Yes [provider]       REVIEW OF SYSTEMS    (2-9 systems for level 4, 10 or more for level 5)      Review of Systems   Constitutional: Negative.    HENT: Negative.     Eyes: Negative.    Respiratory: Negative.     Gastrointestinal: Negative.    Endocrine: Negative.    Genitourinary: Negative.    Musculoskeletal:  Positive for back pain and neck pain.   Skin: Negative.    Allergic/Immunologic: Negative.     Neurological:  Positive for headaches.   Hematological: Negative.    Psychiatric/Behavioral: Negative.         PHYSICAL EXAM   (upto 7 for level 4, 8 or more for level 5)      INITIAL VITALS:  height is 1.727 m (5\' 8" ) and weight is 105.7 kg (233 lb). His oral temperature is 98.2 F (36.8 C). His blood pressure is 119/67 and his pulse is 73. His respiration is 16 and oxygen saturation is 97%.      Physical Exam  Constitutional:       General: He is not in acute distress.     Appearance: Normal appearance. He is normal weight. He is not ill-appearing, toxic-appearing or diaphoretic.   HENT:      Head: Normocephalic.      Right Ear: Tympanic membrane normal.      Left Ear: Tympanic membrane normal.      Nose: Nose normal.      Mouth/Throat:      Mouth: Mucous membranes are moist.      Pharynx: Oropharynx is clear.   Eyes:      Extraocular Movements: Extraocular movements intact.      Pupils: Pupils are equal, round, and reactive to light.   Cardiovascular:      Rate and Rhythm: Normal rate and regular rhythm.      Pulses: Normal pulses.   Pulmonary:      Effort: Pulmonary effort is normal.      Breath sounds: Normal breath sounds.   Abdominal:      General: There is no distension.      Palpations: Abdomen is soft. There is no mass.      Tenderness: There is no abdominal tenderness. There is no guarding or rebound.      Hernia: No hernia is present.   Musculoskeletal:      Cervical back: Normal range of motion and neck supple. No rigidity or tenderness.   Lymphadenopathy:      Cervical: No cervical adenopathy.   Skin:     General: Skin is warm and dry.      Capillary Refill: Capillary refill takes less than 2 seconds.   Neurological:      General: No focal deficit present.      Mental Status: He is alert and oriented to person, place, and time.   Psychiatric:  Mood and Affect: Mood normal.         EMERGENCY DEPARTMENT COURSE AND MEDICAL DECISION MAKING     DIFFERENTIAL DIAGNOSIS    ***    ED COURSE:    ED  Course as of 03/05/23 2042   Tue Mar 05, 2023   2020 The head shows no acute intracranial abnormality, intracranial bleeding or fracture.  CT of the cervical spine shows no acute cervical spine abnormalities there is an incidental finding of a 6.1 cm thyroid nodule recommending nonemergent follow-up with ultrasound imaging.  There are notes that it has a mass effect on the trachea displacing into the right however the patient does not have any signs of respiratory distress and the trachea does not appear to be deviated to the right on physical examination.  CT of the thoracic spine shows no acute abnormalities or vertebral fracture.  Will give the patient a dose of Toradol and discharge him home with plans to follow-up with occupational health. [AH]      ED Course User Index  [AH] Tonny Branch, APRN - CNP       Medical Decision Making:    ***    Plan (Labs/Imaging/EKG):  Orders Placed This Encounter   Procedures   . CT Head WO Contrast   . CT CSpine W/O Contrast   . CT THORACIC SPINE WO CONTRAST       Medications Ordered:  No orders of the defined types were placed in this encounter.      Controlled Substances Monitoring:      DIAGNOSTIC RESULTS / RE-EVALUATION     Radiology:   I directly visualized(with the attending physician) the following  images and reviewed the radiologist interpretations:  No results found.    CT Head WO Contrast    (Results Pending)   CT CSpine W/O Contrast    (Results Pending)   CT THORACIC SPINE WO CONTRAST    (Results Pending)       Labs:  No results found for this visit on 03/05/23.    Consults:  ***    Procedures:  ***    Re-Evaluation & Disposition:  ***      FINAL IMPRESSION      No diagnosis found.      DISPOSITION / PLAN     Disposition     Patient Refferred to:  No follow-up provider specified.    Discharge Medications:  New Prescriptions    No medications on file       Tonny Branch, APRN - CNP   Emergency Medicine Nurse Practitioner    (Please note that portions of this note were  completed with a voice recognitionprogram.  Efforts were made to edit the dictations but occasionally words are mis-transcribed.)

## 2023-03-06 MED ORDER — KETOROLAC TROMETHAMINE 15 MG/ML IJ SOLN
15 | Freq: Once | INTRAMUSCULAR | Status: AC
Start: 2023-03-06 — End: 2023-03-05
  Administered 2023-03-06: 02:00:00 15 mg via INTRAMUSCULAR

## 2023-03-06 MED FILL — KETOROLAC TROMETHAMINE 15 MG/ML IJ SOLN: 15 MG/ML | INTRAMUSCULAR | Qty: 1

## 2023-03-09 ENCOUNTER — Emergency Department: Admit: 2023-03-09 | Payer: MEDICAID | Primary: Internal Medicine

## 2023-03-09 ENCOUNTER — Inpatient Hospital Stay
Admit: 2023-03-09 | Discharge: 2023-03-09 | Disposition: A | Discharge status: Home / Self Care | Payer: MEDICAID | Attending: Emergency Medicine

## 2023-03-09 DIAGNOSIS — M545 Low back pain, unspecified: Secondary | ICD-10-CM

## 2023-03-09 MED ORDER — LIDOCAINE 4 % EX PTCH
4 | Freq: Once | CUTANEOUS | Status: DC
Start: 2023-03-09 — End: 2023-03-09
  Administered 2023-03-09: 19:00:00 1 via TRANSDERMAL

## 2023-03-09 MED ORDER — ACETAMINOPHEN 500 MG PO TABS
500 | Freq: Once | ORAL | Status: AC
Start: 2023-03-09 — End: 2023-03-09
  Administered 2023-03-09: 19:00:00 1000 mg via ORAL

## 2023-03-09 MED FILL — ACETAMINOPHEN EXTRA STRENGTH 500 MG PO TABS: 500 MG | ORAL | Qty: 2

## 2023-03-09 MED FILL — ASPERCREME LIDOCAINE 4 % EX PTCH: 4 % | CUTANEOUS | Qty: 1

## 2023-03-09 NOTE — ED Provider Notes (Signed)
[name]  [id number]   [facility] [place] [address]  Phone: [phone]  Fax: [phone]      Attending Physician Attestation    I performed a history and physical examination of the patient and discussed management with the mid level provider. I reviewed the mid level provider's note and agree with the documented findings and plan of care. Any areas of disagreement are noted on the chart. I was personally present for the key portions of any procedures. I have documented in the chart those procedures where I was not present during the key portions. I have reviewed the emergency nurses triage note. I agree with the chief complaint, past medical history, past surgical history, allergies, medications, social and family history as documented unless otherwise noted below. Documentation of the HPI, Physical Exam and Medical Decision Making performed by mid level providers is based on my personal performance of the HPI, PE and MDM. For Physician Assistant/ Nurse Practitioner cases/documentation I have personally evaluated this patient and have completed at least one if not all key elements of the E/M (history, physical exam, and MDM). Additional findings are as noted.      CHIEF COMPLAINT       Chief Complaint   Patient presents with    Back Pain    Neck Pain     Pt arrives with co neck pain and back pain which occurred Thursday when pt was working/ pt states he hit his neck on a beam at work. Pt was evaluated and had imaging but does want more imaging done of back          [name] is a 57 y.o. male who presents with ongoing back pain.       PAST MEDICAL HISTORY    has a past medical history of Arthritis, Bipolar 1 disorder ([place]), Diabetes mellitus ([place]), Hyperlipidemia, Hypertension, and OSA (obstructive sleep apnea).    SURGICAL HISTORY      has no past surgical history on file.    CURRENT MEDICATIONS       Previous Medications     ASENAPINE (SECUADO) 5.7 MG/24HR PT24    Apply 1 patch topically daily    ATORVASTATIN (LIPITOR) 40 MG TABLET    Take 1 tablet by mouth daily    DAPAGLIFLOZIN (FARXIGA) 10 MG TABLET    Take 1 tablet by mouth daily    EMTRICITABINE-TENOFOVIR (TRUVADA) 200-300 MG PER TABLET    Take 1 tablet by mouth daily    HYDROXYZINE HCL (ATARAX) 10 MG TABLET    Take 1 tablet by mouth every 8 hours as needed    LISINOPRIL (PRINIVIL;ZESTRIL) 40 MG TABLET    Take 1 tablet by mouth daily    MELOXICAM (MOBIC) 15 MG TABLET    Take 1 tablet by mouth daily    SEMAGLUTIDE,0.25 OR 0.'5MG'$ /DOS, (OZEMPIC, 0.25 OR 0.5 MG/DOSE,) 2 MG/3ML SOPN    Inject 0.5 mg into the skin once a week       ALLERGIES     is allergic to vraylar [cariprazine].    FAMILY HISTORY     has no family status information on file.      family history is not on file.    SOCIAL HISTORY      reports that he has never smoked. He has never used smokeless tobacco. He reports that he does not currently use alcohol. He reports that he does not use drugs.  PHYSICAL EXAM     INITIAL VITALS:  height is 1.727 m ('5\' 8"'$ ) and weight is 101.2 kg (223 lb). His temperature is 97.9 F (36.6 C). His blood pressure is 128/61 and his pulse is 88. His respiration is 16 and oxygen saturation is 96%.            DIAGNOSTIC RESULTS     RADIOLOGY:  CT LUMBAR SPINE WO CONTRAST    Result Date: 03/09/2023  EXAMINATION: CT OF THE LUMBAR SPINE WITHOUT CONTRAST  03/09/2023 TECHNIQUE: CT of the lumbar spine was performed without the administration of intravenous contrast. Multiplanar reformatted images are provided for review. Adjustment of mA and/or kV according to patient size was utilized.  Automated exposure control, iterative reconstruction, and/or weight based adjustment of the mA/kV was utilized to reduce the radiation dose to as low as reasonably achievable. COMPARISON: None HISTORY: ORDERING SYSTEM PROVIDED HISTORY: fall, low back pain TECHNOLOGIST PROVIDED HISTORY: fall, low back pain Decision  Support Exception - unselect if not a suspected or confirmed emergency medical condition->Emergency Medical Condition (MA) Reason for Exam: fall x 3 days ago, c/o low back pain FINDINGS: BONES/ALIGNMENT: There is normal alignment of the spine. The vertebral body heights are maintained. No osseous destructive lesion is seen. DEGENERATIVE CHANGES: Mild degenerative changes of the lumbar spine. SOFT TISSUES/RETROPERITONEUM: No paraspinal mass is seen.  Bilateral calyceal calculi.     No acute osseous abnormality.     CT THORACIC SPINE WO CONTRAST    Result Date: 03/05/2023  EXAMINATION: CT OF THE THORACIC SPINE WITHOUT CONTRAST  03/05/2023 6:32 pm: TECHNIQUE: CT of the thoracic spine was performed without the administration of intravenous contrast. Multiplanar reformatted images are provided for review. Automated exposure control, iterative reconstruction, and/or weight based adjustment of the mA/kV was utilized to reduce the radiation dose to as low as reasonably achievable. COMPARISON: None. HISTORY: ORDERING SYSTEM PROVIDED HISTORY: fall hit lower neck/upper back on a steel beam TECHNOLOGIST PROVIDED HISTORY: fall hit lower neck/upper back on a steel beam Reason for Exam: Fall; pain. FINDINGS: BONES/ALIGNMENT: There is normal alignment of the spine. The vertebral body heights are maintained. No osseous destructive lesion is seen. DEGENERATIVE CHANGES: Multilevel disc height loss and mild anterior endplate osteophyte formation.  Posterior disc osteophyte complex at T11-12 at least mildly narrows the spinal canal.  No appreciable high-grade thoracic neural foraminal narrowing. SOFT TISSUES: No paraspinal mass is seen.  Small nonobstructive bilateral renal calculi measuring up to 4 mm on the right and 3 mm on the left.     No acute thoracic spine fracture or traumatic subluxation.     CT CSpine W/O Contrast    Result Date: 03/05/2023  EXAMINATION: CT OF THE CERVICAL SPINE WITHOUT CONTRAST 03/05/2023 7:32 pm TECHNIQUE: CT of  the cervical spine was performed without the administration of intravenous contrast. Multiplanar reformatted images are provided for review. Automated exposure control, iterative reconstruction, and/or weight based adjustment of the mA/kV was utilized to reduce the radiation dose to as low as reasonably achievable. COMPARISON: None. HISTORY: ORDERING SYSTEM PROVIDED HISTORY: fall hit lower neck/uper back on a steel beam TECHNOLOGIST PROVIDED HISTORY: fall hit lower neck/uper back on a steel beam Decision Support Exception - unselect if not a suspected or confirmed emergency medical condition->Emergency Medical Condition (MA) Reason for Exam: Fall; pain. FINDINGS: BONES/ALIGNMENT: There is no acute fracture or traumatic malalignment. DEGENERATIVE CHANGES: Reversal the normal cervical spine lordosis. Intervertebral disc space narrowing endplate osteophyte formation at multiple levels in the cervical spine.  C1-2 articulation cervical occipital junction are intact. Posterior disc osteophyte complex at multiple levels in the cervical spine. This may abut the traversing cervical spinal cord.  No significant central canal stenosis is seen. SOFT TISSUES: 5.0 x 6.1 cm nodule in the left lobe of the thyroid.  This exerts mass effect on the trachea displacing it to the right.  Remainder of the imaged paraspinous soft tissues are unremarkable in appearance.  Imaged lung apices are clear.     Spondylosis of the cervical spine.  No fracture. Large nodule in the left lobe of the thyroid was exerts mass effect on the trachea. RECOMMENDATIONS: 6.1 cm incidental left thyroid nodule. Recommend non-emergent thyroid ultrasound. Reference: J Am Coll Radiol. 2015 Feb;12(2): 143-50     CT Head WO Contrast    Result Date: 03/05/2023  EXAMINATION: CT OF THE HEAD WITHOUT CONTRAST  03/05/2023 7:32 pm TECHNIQUE: CT of the head was performed without the administration of intravenous contrast. Automated exposure control, iterative reconstruction,  and/or weight based adjustment of the mA/kV was utilized to reduce the radiation dose to as low as reasonably achievable. COMPARISON: None. HISTORY: ORDERING SYSTEM PROVIDED HISTORY: fall TECHNOLOGIST PROVIDED HISTORY: fall Decision Support Exception - unselect if not a suspected or confirmed emergency medical condition->Emergency Medical Condition (MA) Reason for Exam: Fall; pain. FINDINGS: BRAIN/VENTRICLES: There is no acute intracranial hemorrhage, mass effect or midline shift.  No abnormal extra-axial fluid collection.  The gray-white differentiation is maintained without evidence of an acute infarct.  There is no evidence of hydrocephalus. ORBITS: The visualized portion of the orbits demonstrate no acute abnormality. SINUSES: The visualized paranasal sinuses and mastoid air cells demonstrate no acute abnormality. SOFT TISSUES/SKULL:  No acute abnormality of the visualized skull or soft tissues.     No acute intracranial abnormality.     Non-plain film images such as CT, Ultrasound and MRI are read by the radiologist. Plain radiographic images are visualized and the radiologist interpretations are reviewed as follows:     CT LUMBAR SPINE WO CONTRAST   Final Result   No acute osseous abnormality.             LABS:  No results found for this visit on 03/09/23.        EMERGENCY DEPARTMENT COURSE:   Vitals:    Vitals:    03/09/23 1316   BP: 128/61   Pulse: 88   Resp: 16   Temp: 97.9 F (36.6 C)   SpO2: 96%   Weight: 101.2 kg (223 lb)   Height: 1.727 m ('5\' 8"'$ )     -------------------------  BP: 128/61, Temp: 97.9 F (36.6 C), Pulse: 88, Respirations: 16      PERTINENT ATTENDING PHYSICIAN COMMENTS:    This is a nice 57 year old male that was personally seen and evaluated by myself in conjunction with nurse practitioner Marcelline Deist.  The patient presented here with concerns of ongoing back pain since his work-related injury last week Wednesday at [employer].  Patient admitted he did not follow-up with occupational  health as he was supposed to.  He stated he is due to be back to work tomorrow and came here for reevaluation as well as a work note.    The examination did not reveal any acute or emergent concern requiring any need for MRI admission or transfer.  He did insist on imaging which is CT lumbar spine was ordered read and interpreted by radiology as nothing acute.  Patient was discharged home with strict instructions to follow-up  with his occupational health physician and Monday.  He was given a work note to be off tomorrow.  Patient feels comfortable disposition and treatment plan.    The patient presents with low back pain without signs of spinal cord compression, cauda equina syndrome, infection, aneurysm or other serious etiology. The patient is neurologically intact and appears stable for discharge. No skin lesions were seen. The pulses are 2/4 bilaterally. The motor is 5/5 bilaterally. The sensation is intact.  Given the extremely low risk of these diagnoses further testing and evaluation for these possibilities does not appear to be indicated at this time. The patient was advised to return to the Emergency Department for worsening pain, numbness, weakness, difficulty with urination or defecation, difficulty with ambulation, fever, abdominal pain, coolness or color change to the extremity.      The patient understands that at this time there is no evidence for a more malignant underlying process, but the patient also understands that early in the process of an illness or injury, an emergency department workup can be falsely reassuring.  Routine discharge counseling was given, and the patient understands that worsening, changing or persistent symptoms should prompt an immediate call or follow up with their primary physician or return to the emergency department. The importance of appropriate follow up was also discussed.  I have reviewed the disposition diagnosis with the patient and or their family/guardian.  I  have answered their questions and given discharge instructions.  They voiced understanding of these instructions and did not have any further questions or complaints.               (Please note that portions of this note were completed with a voice recognition program.  Efforts were made to edit the dictations but occasionally words are mis-transcribed.)    Michiel Cowboy, DO  Board Certified Emergency Medicine Physician       Michiel Cowboy, DO  03/09/23 1527

## 2023-03-09 NOTE — ED Provider Notes (Signed)
[facility]  [phone]  [facility] [place] [address]  Phone: [phone]  Fax: [phone]        Pt Name: Daniel Hensley  MRN: [id number]  [place] January 07, 1966  Date of evaluation: 03/09/23    [facility]       Chief Complaint   Patient presents with    Back Pain    Neck Pain     Pt arrives with co neck pain and back pain which occurred Thursday when pt was working/ pt states he hit his neck on a beam at work. Pt was evaluated and had imaging but does want more imaging done of back        HISTORY OF PRESENT ILLNESS (Location/Symptom, Timing/Onset, Context/Setting, Quality, Duration, Modifying Factors, Severity)      Daniel Hensley is a 57 y.o. male with no pertinent PMH who presents to the ED via private auto with complaint of intermittent dull headache, neck and shoulder soreness, lower back pain since 3/5. Patient was struck in the head with a metal beam on 3/5 while at work, no LOC. Was evaluated in ED with CT scan of his head, cervical spine and thoracic spine. No previous injury or surgeries to neck or back. No numbness, tingling, weakness to arms or legs. No difficulty urinating or having bms. No fever, chills, vomiting, vision changes, chest pain, shortness of breath. No pain medication taken over the counter prior to arrival. Lower back pain 7/10. Patient reports he did not follow-up with occupational health, drove to visit a friend in [place] and states he did not see [facility] f/u on his discharge paperwork.     PAST MEDICAL / SURGICAL / SOCIAL / FAMILY HISTORY     PMH:  has a past medical history of Arthritis, Bipolar 1 disorder ([place]), Diabetes mellitus ([place]), Hyperlipidemia, Hypertension, and OSA (obstructive sleep apnea).  Surgical History:  has no past surgical history on file.  Social History:  reports that he has never smoked. He has never used smokeless tobacco. He reports that he does not currently use alcohol. He reports that he does not use  drugs.  Family History: has no family status information on file.    family history is not on file.  Psychiatric History: None    Allergies: Vraylar [cariprazine]    Home Medications:   Prior to Admission medications    Medication Sig Start Date End Date Taking? Authorizing Provider   Semaglutide,0.25 or 0.'5MG'$ /DOS, (OZEMPIC, 0.25 OR 0.5 MG/DOSE,) 2 MG/3ML SOPN Inject 0.5 mg into the skin once a week 02/22/23   [provider]   meloxicam (MOBIC) 15 MG tablet Take 1 tablet by mouth daily 02/21/23   [provider]   hydrOXYzine HCl (ATARAX) 10 MG tablet Take 1 tablet by mouth every 8 hours as needed 02/06/23   [provider]   lisinopril (PRINIVIL;ZESTRIL) 40 MG tablet Take 1 tablet by mouth daily 02/06/23   [provider]   emtricitabine-tenofovir (TRUVADA) 200-300 MG per tablet Take 1 tablet by mouth daily 04/09/22   [provider]   dapagliflozin (FARXIGA) 10 MG tablet Take 1 tablet by mouth daily 05/15/22   [provider]   atorvastatin (LIPITOR) 40 MG tablet Take 1 tablet by mouth daily 02/06/23   [provider]   Asenapine (SECUADO) 5.7 MG/24HR PT24 Apply 1 patch topically daily 01/26/22   [provider]       REVIEW OF SYSTEMS  (2-9 systems for level  4, 10 ormore for level 5)      Review of Systems   Constitutional:  Negative for chills, diaphoresis, fatigue and fever.   Eyes:  Negative for photophobia and visual disturbance.   Respiratory:  Negative for cough, chest tightness and shortness of breath.    Cardiovascular:  Negative for chest pain, palpitations and leg swelling.   Gastrointestinal:  Negative for abdominal pain, constipation, diarrhea, nausea and vomiting.   Genitourinary:  Negative for hematuria.   Musculoskeletal:  Positive for back pain, myalgias and neck pain. Negative for gait problem, joint swelling and neck stiffness.   Skin:  Negative for pallor, rash and wound.   Neurological:  Positive for headaches. Negative for  dizziness, weakness, light-headedness and numbness.     All other systems negative except as marked.     PHYSICAL EXAM  (up to 7 for level 4, 8 or more for level 5)      INITIAL VITALS:  height is 1.727 m ('5\' 8"'$ ) and weight is 101.2 kg (223 lb). His temperature is 97.9 F (36.6 C). His blood pressure is 128/61 and his pulse is 88. His respiration is 16 and oxygen saturation is 96%.      Vital signs reviewed.    Physical Exam  Vitals and nursing note reviewed.   Constitutional:       General: He is not in acute distress.     Appearance: Normal appearance. He is normal weight. He is not ill-appearing, toxic-appearing or diaphoretic.   HENT:      Head: Normocephalic and atraumatic. No raccoon eyes, Battle's sign, right periorbital erythema or left periorbital erythema.      Jaw: There is normal jaw occlusion. No trismus.      Right Ear: Tympanic membrane, ear canal and external ear normal. No hemotympanum.      Left Ear: Tympanic membrane, ear canal and external ear normal. No hemotympanum.      Nose: Nose normal. No congestion or rhinorrhea.      Mouth/Throat:      Mouth: Mucous membranes are moist.      Pharynx: Oropharynx is clear.   Eyes:      General: No scleral icterus.        Right eye: No discharge.         Left eye: No discharge.      Extraocular Movements: Extraocular movements intact.      Conjunctiva/sclera: Conjunctivae normal.      Pupils: Pupils are equal, round, and reactive to light.   Cardiovascular:      Rate and Rhythm: Normal rate and regular rhythm.      Pulses: Normal pulses.      Heart sounds: Normal heart sounds. No murmur heard.  Pulmonary:      Effort: Pulmonary effort is normal. No respiratory distress.      Breath sounds: Normal breath sounds. No stridor. No wheezing, rhonchi or rales.   Chest:      Chest wall: No tenderness.   Abdominal:      General: Abdomen is flat. There is no distension.      Palpations: Abdomen is soft.      Tenderness: There is no abdominal tenderness. There is no  right CVA tenderness, left CVA tenderness, guarding or rebound.   Musculoskeletal:         General: No deformity or signs of injury. Normal range of motion.      Cervical back: Normal range of motion and neck supple. No deformity,  spasms, tenderness or bony tenderness. No pain with movement. Normal range of motion.      Thoracic back: No deformity, spasms, tenderness or bony tenderness. Normal range of motion.      Lumbar back: Tenderness and bony tenderness present. No deformity or spasms. Normal range of motion.      Right lower leg: No edema.      Left lower leg: No edema.      Comments: CTLS w/o step offs or deformities.  NVI to BLE and BUE. Equal strength demonstrated by patient with equal hands grasps and dorsi/plantar flexion.  Intact sensation to BLE and BUE.   No saddle anesthesia.   Lymphadenopathy:      Cervical: No cervical adenopathy.   Skin:     General: Skin is warm and dry.      Capillary Refill: Capillary refill takes less than 2 seconds.      Coloration: Skin is not jaundiced or pale.      Findings: No rash.   Neurological:      General: No focal deficit present.      Mental Status: He is alert and oriented to person, place, and time.      GCS: GCS eye subscore is 4. GCS verbal subscore is 5. GCS motor subscore is 6.      Cranial Nerves: No cranial nerve deficit, dysarthria or facial asymmetry.      Sensory: Sensation is intact. No sensory deficit.      Motor: No weakness.      Gait: Gait normal.         DIFFERENTIAL DIAGNOSIS / MDM     Patient presents emergency department with complaint described above.  Patient with intact sensation to bilateral upper and lower extremities.  Ambulates with steady gait.  No saddle anesthesia.  CTL spine without step-off or deformity.  Patient endorses diffuse tenderness to the lumbar spine with palpation, no overlying skin changes or lesions.  Equal hand grasp bilaterally, equal dorsi and plantarflexion bilaterally.  Patient neurological exam is unremarkable.   Pupils are round, equal in size and reactive to light.  EOMs intact bilaterally.  TMs intact bilaterally, no hemotympanum.  No dysarthria, facial asymmetry or focal deficits.  Patient afebrile nontachycardic, 96% on room air.  Will give Tylenol and place lidocaine patch to his neck for soreness to his neck and shoulders.  Will obtain CT of his lumbar spine as he reports the pain has slowly been getting worse and he is concerned that they did not do any imaging during his first initial visit.   CT scan of lumbar spine negative for acute fracture/findings.  I explained that he should follow-up with occupational health on Monday for further evaluation and treatment recommendations.  Continue to take Tylenol over-the-counter, as needed as directed for headache.  Limit screen time, take Benadryl and laying in a dark room with persistent headache.  Return to the emergency department with any new or worsening symptoms.    PLAN (LABS / IMAGING / EKG):  Orders Placed This Encounter   Procedures    CT LUMBAR SPINE WO CONTRAST       MEDICATIONS ORDERED:  Orders Placed This Encounter   Medications    lidocaine 4 % external patch 1 patch    acetaminophen (TYLENOL) tablet 1,000 mg         DIAGNOSTIC RESULTS     EKG: All EKG's are interpreted by the Emergency Department Physician who either signs or Co-signs this chart in the  absenceof a cardiologist.      RADIOLOGY: All images are read by the radiologist and their interpretations are reviewed.    CT LUMBAR SPINE WO CONTRAST    Result Date: 03/09/2023  EXAMINATION: CT OF THE LUMBAR SPINE WITHOUT CONTRAST  03/09/2023 TECHNIQUE: CT of the lumbar spine was performed without the administration of intravenous contrast. Multiplanar reformatted images are provided for review. Adjustment of mA and/or kV according to patient size was utilized.  Automated exposure control, iterative reconstruction, and/or weight based adjustment of the mA/kV was utilized to reduce the radiation dose to as low  as reasonably achievable. COMPARISON: None HISTORY: ORDERING SYSTEM PROVIDED HISTORY: fall, low back pain TECHNOLOGIST PROVIDED HISTORY: fall, low back pain Decision Support Exception - unselect if not a suspected or confirmed emergency medical condition->Emergency Medical Condition (MA) Reason for Exam: fall x 3 days ago, c/o low back pain FINDINGS: BONES/ALIGNMENT: There is normal alignment of the spine. The vertebral body heights are maintained. No osseous destructive lesion is seen. DEGENERATIVE CHANGES: Mild degenerative changes of the lumbar spine. SOFT TISSUES/RETROPERITONEUM: No paraspinal mass is seen.  Bilateral calyceal calculi.     No acute osseous abnormality.     CT THORACIC SPINE WO CONTRAST    Result Date: 03/05/2023  EXAMINATION: CT OF THE THORACIC SPINE WITHOUT CONTRAST  03/05/2023 6:32 pm: TECHNIQUE: CT of the thoracic spine was performed without the administration of intravenous contrast. Multiplanar reformatted images are provided for review. Automated exposure control, iterative reconstruction, and/or weight based adjustment of the mA/kV was utilized to reduce the radiation dose to as low as reasonably achievable. COMPARISON: None. HISTORY: ORDERING SYSTEM PROVIDED HISTORY: fall hit lower neck/upper back on a steel beam TECHNOLOGIST PROVIDED HISTORY: fall hit lower neck/upper back on a steel beam Reason for Exam: Fall; pain. FINDINGS: BONES/ALIGNMENT: There is normal alignment of the spine. The vertebral body heights are maintained. No osseous destructive lesion is seen. DEGENERATIVE CHANGES: Multilevel disc height loss and mild anterior endplate osteophyte formation.  Posterior disc osteophyte complex at T11-12 at least mildly narrows the spinal canal.  No appreciable high-grade thoracic neural foraminal narrowing. SOFT TISSUES: No paraspinal mass is seen.  Small nonobstructive bilateral renal calculi measuring up to 4 mm on the right and 3 mm on the left.     No acute thoracic spine fracture or  traumatic subluxation.     CT CSpine W/O Contrast    Result Date: 03/05/2023  EXAMINATION: CT OF THE CERVICAL SPINE WITHOUT CONTRAST 03/05/2023 7:32 pm TECHNIQUE: CT of the cervical spine was performed without the administration of intravenous contrast. Multiplanar reformatted images are provided for review. Automated exposure control, iterative reconstruction, and/or weight based adjustment of the mA/kV was utilized to reduce the radiation dose to as low as reasonably achievable. COMPARISON: None. HISTORY: ORDERING SYSTEM PROVIDED HISTORY: fall hit lower neck/uper back on a steel beam TECHNOLOGIST PROVIDED HISTORY: fall hit lower neck/uper back on a steel beam Decision Support Exception - unselect if not a suspected or confirmed emergency medical condition->Emergency Medical Condition (MA) Reason for Exam: Fall; pain. FINDINGS: BONES/ALIGNMENT: There is no acute fracture or traumatic malalignment. DEGENERATIVE CHANGES: Reversal the normal cervical spine lordosis. Intervertebral disc space narrowing endplate osteophyte formation at multiple levels in the cervical spine.  C1-2 articulation cervical occipital junction are intact. Posterior disc osteophyte complex at multiple levels in the cervical spine. This may abut the traversing cervical spinal cord.  No significant central canal stenosis is seen. SOFT TISSUES: 5.0 x 6.1 cm nodule in  the left lobe of the thyroid.  This exerts mass effect on the trachea displacing it to the right.  Remainder of the imaged paraspinous soft tissues are unremarkable in appearance.  Imaged lung apices are clear.     Spondylosis of the cervical spine.  No fracture. Large nodule in the left lobe of the thyroid was exerts mass effect on the trachea. RECOMMENDATIONS: 6.1 cm incidental left thyroid nodule. Recommend non-emergent thyroid ultrasound. Reference: J Am Coll Radiol. 2015 Feb;12(2): 143-50     CT Head WO Contrast    Result Date: 03/05/2023  EXAMINATION: CT OF THE HEAD WITHOUT CONTRAST   03/05/2023 7:32 pm TECHNIQUE: CT of the head was performed without the administration of intravenous contrast. Automated exposure control, iterative reconstruction, and/or weight based adjustment of the mA/kV was utilized to reduce the radiation dose to as low as reasonably achievable. COMPARISON: None. HISTORY: ORDERING SYSTEM PROVIDED HISTORY: fall TECHNOLOGIST PROVIDED HISTORY: fall Decision Support Exception - unselect if not a suspected or confirmed emergency medical condition->Emergency Medical Condition (MA) Reason for Exam: Fall; pain. FINDINGS: BRAIN/VENTRICLES: There is no acute intracranial hemorrhage, mass effect or midline shift.  No abnormal extra-axial fluid collection.  The gray-white differentiation is maintained without evidence of an acute infarct.  There is no evidence of hydrocephalus. ORBITS: The visualized portion of the orbits demonstrate no acute abnormality. SINUSES: The visualized paranasal sinuses and mastoid air cells demonstrate no acute abnormality. SOFT TISSUES/SKULL:  No acute abnormality of the visualized skull or soft tissues.     No acute intracranial abnormality.        LABS:  No results found for this visit on 03/09/23.    EMERGENCY DEPARTMENT COURSE           Vitals:    Vitals:    03/09/23 1316   BP: 128/61   Pulse: 88   Resp: 16   Temp: 97.9 F (36.6 C)   SpO2: 96%   Weight: 101.2 kg (223 lb)   Height: 1.727 m ('5\' 8"'$ )     -------------------------  BP: 128/61, Temp: 97.9 F (36.6 C), Pulse: 88, Respirations: 16      RE-EVALUATION:  See ED Course notes above.    DISCHARGE PLANNING:    The patient presents with low back pain without signs of spinal cord compression, cauda equina syndrome, infection, aneurysm or other serious etiology. The patient is neurologically intact and appears stable for discharge. No skin lesions were seen. The pulses are 2/4 bilaterally. The motor is 5/5 bilaterally. The sensation is intact.  Given the extremely low risk of these diagnoses further  testing and evaluation for these possibilities does not appear to be indicated at this time. The patient was advised to return to the Emergency Department for worsening pain, numbness, weakness, difficulty with urination or defecation, difficulty with ambulation, fever, abdominal pain, coolness or color change to the extremity.      The patient understands that at this time there is no evidence for a more malignant underlying process, but the patient also understands that early in the process of an illness or injury, an emergency department workup can be falsely reassuring.  Routine discharge counseling was given, and the patient understands that worsening, changing or persistent symptoms should prompt an immediate call or follow up with their primary physician or return to the emergency department. The importance of appropriate follow up was also discussed.  I have reviewed the disposition diagnosis with the patient and or their family/guardian.  I have answered their  questions and given discharge instructions.  They voiced understanding of these instructions and did not have any further questions or complaints.    This patient was seen by the attending physician and they agreed with the assessment and plan.    CONSULTS:  None    PROCEDURES:  None    FINAL IMPRESSION      1. Acute low back pain without sciatica, unspecified back pain laterality    2. Closed head injury, subsequent encounter          DISPOSITION / PLAN     CONDITION ON DISPOSITION:   Good / Stable for discharge.     PATIENT REFERRED TO:  [place]  [place] [address]  [phone]  Call         DISCHARGE MEDICATIONS:  Discharge Medication List as of 03/09/2023  3:23 PM          Marcelline Deist, APRN - CNP   Emergency Medicine Nurse Practitioner    (Please note that portions of this note were completed with a voice recognition program.  Efforts were made to edit the dictations but  occasionally words aremis-transcribed.)        Marcelline Deist, APRN - CNP  03/09/23 1529

## 2023-03-09 NOTE — Discharge Instructions (Addendum)
Take your medication as indicated and prescribed.  For pain use acetaminophen (Tylenol).  You can take over the counter acetaminophen tablets (1 - 2 tablets of the 500-mg strength every 6 hours).      Do not take any medication that contains aspirin / ibuprofen or blood thinning properties until seen by your physician.  Do not do any sporting activity (running, playing basketball / football, etc) until you are seen by your physician.    For persistent headache you can try sitting in a cool, dark room and try taking 1 - 2 tabs (25 - 50 mg) of diphenhydramine (Benadryl) to help you relax.    PLEASE RETURN TO THE EMERGENCY DEPARTMENT IMMEDIATELY for worsening symptoms, persistent headache, change in vision / hearing / taste, ringing in your ears, sleeping more than normal, or if you develop any concerning symptoms such as: high fever not relieved by acetaminophen (Tylenol) and/or ibuprofen (Motrin / Advil), chills, shortness of breath, chest pain, feeling of your heart fluttering or racing, persistent nausea and/or vomiting, vomiting up blood, blood in your stool, loss of consciousness, numbness, weakness or tingling in the arms or legs or change in color of the extremities, changes in mental status, blurry vision, loss of bladder / bowel control, unable to follow up with your physician, or other any other care or concern.

## 2023-03-11 ENCOUNTER — Inpatient Hospital Stay
Admit: 2023-03-11 | Discharge: 2023-03-11 | Disposition: A | Discharge status: Home / Self Care | Payer: MEDICAID | Attending: Emergency Medicine

## 2023-03-11 DIAGNOSIS — S161XXD Strain of muscle, fascia and tendon at neck level, subsequent encounter: Secondary | ICD-10-CM

## 2023-03-11 MED ORDER — HYDROCODONE-ACETAMINOPHEN 5-325 MG PO TABS
5-325 MG | ORAL_TABLET | Freq: Four times a day (QID) | ORAL | 0 refills | Status: AC | PRN
Start: 2023-03-11 — End: 2023-03-14

## 2023-03-11 MED ORDER — CYCLOBENZAPRINE HCL 10 MG PO TABS
10 | ORAL_TABLET | Freq: Three times a day (TID) | ORAL | 0 refills | Status: AC | PRN
Start: 2023-03-11 — End: 2023-03-21

## 2023-03-11 NOTE — ED Provider Notes (Signed)
[facility]  [id number]  [facility] [place] [address]  Phone: [phone]  Fax: [phone]        Pt Name: Daniel Hensley  MRN: [id number]  [place] 1966-08-11  Date of evaluation: 03/11/23    CHIEFCOMPLAINT       Chief Complaint   Patient presents with    Neck Pain     Work related injury from about 1 week ago.  Pt complains of persisting head and neck pains and inability to lift, push, pull and is having uncontrolled pain.  Shoulder pain, neck pain, back pain.        HISTORY OF PRESENT ILLNESS (Location/Symptom, Timing/Onset, Context/Setting, Quality, Duration, Modifying Factors, Severity)      Daniel Hensley is a 57 y.o. male with no pertinent PMH who presents to the ED via private auto with complaints of ongoing neck pain after a work injury a week ago.  He fell at work injuring his neck a week ago he did have CT scan which was unremarkable.  He was informed to use over-the-counter analgesics and follow-up with occupational health.  He has had trouble following up with occupational health.  He tried to follow-up with his primary care who states they do not do Worker's Comp. injuries.  He is having increased cervical pain with muscle spasms of the back no numbness and tingling of the upper extremities no weakness of the upper extremities he does have a mild posterior headache.  He has tried over-the-counter Tylenol without much relief.     PAST MEDICAL / SURGICAL / SOCIAL / FAMILY HISTORY     PMH:  has a past medical history of Arthritis, Bipolar 1 disorder ([place]), Diabetes mellitus ([place]), Hyperlipidemia, Hypertension, and OSA (obstructive sleep apnea).  Surgical History:  has no past surgical history on file.  Social History:  reports that he has never smoked. He has never used smokeless tobacco. He reports that he does not currently use alcohol. He reports that he does not use drugs.  Family History: has no family status information on file.    family history  is not on file.  Psychiatric History: None    Allergies: Vraylar [cariprazine]    Home Medications:   Prior to Admission medications    Medication Sig Start Date End Date Taking? Authorizing Provider   cyclobenzaprine (FLEXERIL) 10 MG tablet Take 1 tablet by mouth 3 times daily as needed for Muscle spasms 03/11/23 03/21/23 Yes Deano Tomaszewski, Nori Riis, APRN - CNP   HYDROcodone-acetaminophen (NORCO) 5-325 MG per tablet Take 1 tablet by mouth every 6 hours as needed for Pain for up to 3 days. Intended supply: 3 days. Take lowest dose possible to manage pain Max Daily Amount: 4 tablets 03/11/23 03/14/23 Yes Kialee Kham, Nori Riis, APRN - CNP   Semaglutide,0.25 or 0.'5MG'$ /DOS, (OZEMPIC, 0.25 OR 0.5 MG/DOSE,) 2 MG/3ML SOPN Inject 0.5 mg into the skin once a week 02/22/23   [provider]   meloxicam (MOBIC) 15 MG tablet Take 1 tablet by mouth daily 02/21/23   [provider]   hydrOXYzine HCl (ATARAX) 10 MG tablet Take 1 tablet by mouth every 8 hours as needed 02/06/23   [provider]   lisinopril (PRINIVIL;ZESTRIL) 40 MG tablet Take 1 tablet by mouth daily 02/06/23   [provider]   emtricitabine-tenofovir (TRUVADA) 200-300 MG per tablet Take 1 tablet by mouth daily 04/09/22   [provider]   dapagliflozin (FARXIGA) 10 MG tablet Take 1  tablet by mouth daily 05/15/22   [provider]   atorvastatin (LIPITOR) 40 MG tablet Take 1 tablet by mouth daily 02/06/23   [provider]   Asenapine (SECUADO) 5.7 MG/24HR PT24 Apply 1 patch topically daily 01/26/22   [provider]       REVIEW OF SYSTEMS  (2-9 systems for level 4, 10 ormore for level 5)      Review of Systems  See HPI.    PHYSICAL EXAM  (up to 7 for level 4, 8 or more for level 5)      INITIAL VITALS:  height is 1.727 m (5' 7.99") and weight is 98 kg (216 lb 0.8 oz). His oral temperature is 99.1 F (37.3 C). His blood pressure is 128/77 and his pulse is 82. His respiration is 18 and oxygen saturation is 98%.       Vital signs reviewed.    Physical Exam  Neck:      Comments: No bony tenderness he does have some cervical muscle tightness and spasm.  Upper extremities are strong with normal range of motion        General:  Alert, cooperative, well-groomed, well-nourished, appears stated age, and is in no acute distress.   Head:  Normocephalic, atraumatic, and without obvious abnormality.   Eyes:  Sclerae/conjunctivae clear without injection, pallor, or icterus. Corneas clear without opacities. EOM's intact.   ENT: Ears and nose are all without obvious masses lesion or deformity.    Neck: Supple and symmetrical. Trachea midline. No jugular venous distention.   Lungs:   No respiratory distress. Clear to auscultation bilaterally. No wheezes, rhonchi, or rales.   Heart:  Regular rate. Regular rhythm. No murmurs, rubs, or gallops.   Abdomen:   Normoactive bowel sounds. Soft, nontender, nondistended without guarding or rebound. No palpable masses.    Extremities: Warm and dry without erythema or edema.    Skin: Soft, good turgor, and well-hydrated.    Neurologic: GCS is 15 and no focal deficits are appreciated. Normal gait. Grossly normal motor and sensation. Speech clear.   Psychiatric: Normal mood and affect. Normal behavior. Coherent thought process.     DIFFERENTIAL DIAGNOSIS / MDM     Differential diagnosis: Herniated cervical disc, cervical strain, muscle spasm      On exam patient has normal range of motion no bony tenderness he has no numbness and tingling or weakness of his upper extremities.  He has some muscle spasm I believe this is cervical strain versus herniated cervical disc.  Without radiculopathy.  Patient will be treated with muscle relaxers and Norco he is already on an anti-inflammatory Mobic.  He is encouraged to continue to try to establish care with occupational health.  He will be written off for 1 week.    PLAN (LABS / IMAGING / EKG):  No orders of the defined types were placed in this  encounter.      MEDICATIONS ORDERED:  Orders Placed This Encounter   Medications    cyclobenzaprine (FLEXERIL) 10 MG tablet     Sig: Take 1 tablet by mouth 3 times daily as needed for Muscle spasms     Dispense:  21 tablet     Refill:  0    HYDROcodone-acetaminophen (NORCO) 5-325 MG per tablet     Sig: Take 1 tablet by mouth every 6 hours as needed for Pain for up to 3 days. Intended supply: 3 days. Take lowest dose possible to manage pain Max Daily Amount: 4  tablets     Dispense:  12 tablet     Refill:  0       Controlled Substances Monitoring:     DIAGNOSTIC RESULTS     EKG: All EKG's are interpreted by the Emergency Department Physician who either signs or Co-signs this chart in the absenceof a cardiologist.        RADIOLOGY: All images are read by the radiologist and their interpretations are reviewed.    CT LUMBAR SPINE WO CONTRAST    Result Date: 03/09/2023  EXAMINATION: CT OF THE LUMBAR SPINE WITHOUT CONTRAST  03/09/2023 TECHNIQUE: CT of the lumbar spine was performed without the administration of intravenous contrast. Multiplanar reformatted images are provided for review. Adjustment of mA and/or kV according to patient size was utilized.  Automated exposure control, iterative reconstruction, and/or weight based adjustment of the mA/kV was utilized to reduce the radiation dose to as low as reasonably achievable. COMPARISON: None HISTORY: ORDERING SYSTEM PROVIDED HISTORY: fall, low back pain TECHNOLOGIST PROVIDED HISTORY: fall, low back pain Decision Support Exception - unselect if not a suspected or confirmed emergency medical condition->Emergency Medical Condition (MA) Reason for Exam: fall x 3 days ago, c/o low back pain FINDINGS: BONES/ALIGNMENT: There is normal alignment of the spine. The vertebral body heights are maintained. No osseous destructive lesion is seen. DEGENERATIVE CHANGES: Mild degenerative changes of the lumbar spine. SOFT TISSUES/RETROPERITONEUM: No paraspinal mass is seen.  Bilateral  calyceal calculi.     No acute osseous abnormality.     CT THORACIC SPINE WO CONTRAST    Result Date: 03/05/2023  EXAMINATION: CT OF THE THORACIC SPINE WITHOUT CONTRAST  03/05/2023 6:32 pm: TECHNIQUE: CT of the thoracic spine was performed without the administration of intravenous contrast. Multiplanar reformatted images are provided for review. Automated exposure control, iterative reconstruction, and/or weight based adjustment of the mA/kV was utilized to reduce the radiation dose to as low as reasonably achievable. COMPARISON: None. HISTORY: ORDERING SYSTEM PROVIDED HISTORY: fall hit lower neck/upper back on a steel beam TECHNOLOGIST PROVIDED HISTORY: fall hit lower neck/upper back on a steel beam Reason for Exam: Fall; pain. FINDINGS: BONES/ALIGNMENT: There is normal alignment of the spine. The vertebral body heights are maintained. No osseous destructive lesion is seen. DEGENERATIVE CHANGES: Multilevel disc height loss and mild anterior endplate osteophyte formation.  Posterior disc osteophyte complex at T11-12 at least mildly narrows the spinal canal.  No appreciable high-grade thoracic neural foraminal narrowing. SOFT TISSUES: No paraspinal mass is seen.  Small nonobstructive bilateral renal calculi measuring up to 4 mm on the right and 3 mm on the left.     No acute thoracic spine fracture or traumatic subluxation.     CT CSpine W/O Contrast    Result Date: 03/05/2023  EXAMINATION: CT OF THE CERVICAL SPINE WITHOUT CONTRAST 03/05/2023 7:32 pm TECHNIQUE: CT of the cervical spine was performed without the administration of intravenous contrast. Multiplanar reformatted images are provided for review. Automated exposure control, iterative reconstruction, and/or weight based adjustment of the mA/kV was utilized to reduce the radiation dose to as low as reasonably achievable. COMPARISON: None. HISTORY: ORDERING SYSTEM PROVIDED HISTORY: fall hit lower neck/uper back on a steel beam TECHNOLOGIST PROVIDED HISTORY: fall hit  lower neck/uper back on a steel beam Decision Support Exception - unselect if not a suspected or confirmed emergency medical condition->Emergency Medical Condition (MA) Reason for Exam: Fall; pain. FINDINGS: BONES/ALIGNMENT: There is no acute fracture or traumatic malalignment. DEGENERATIVE CHANGES: Reversal the normal cervical spine lordosis. Intervertebral  disc space narrowing endplate osteophyte formation at multiple levels in the cervical spine.  C1-2 articulation cervical occipital junction are intact. Posterior disc osteophyte complex at multiple levels in the cervical spine. This may abut the traversing cervical spinal cord.  No significant central canal stenosis is seen. SOFT TISSUES: 5.0 x 6.1 cm nodule in the left lobe of the thyroid.  This exerts mass effect on the trachea displacing it to the right.  Remainder of the imaged paraspinous soft tissues are unremarkable in appearance.  Imaged lung apices are clear.     Spondylosis of the cervical spine.  No fracture. Large nodule in the left lobe of the thyroid was exerts mass effect on the trachea. RECOMMENDATIONS: 6.1 cm incidental left thyroid nodule. Recommend non-emergent thyroid ultrasound. Reference: J Am Coll Radiol. 2015 Feb;12(2): 143-50     CT Head WO Contrast    Result Date: 03/05/2023  EXAMINATION: CT OF THE HEAD WITHOUT CONTRAST  03/05/2023 7:32 pm TECHNIQUE: CT of the head was performed without the administration of intravenous contrast. Automated exposure control, iterative reconstruction, and/or weight based adjustment of the mA/kV was utilized to reduce the radiation dose to as low as reasonably achievable. COMPARISON: None. HISTORY: ORDERING SYSTEM PROVIDED HISTORY: fall TECHNOLOGIST PROVIDED HISTORY: fall Decision Support Exception - unselect if not a suspected or confirmed emergency medical condition->Emergency Medical Condition (MA) Reason for Exam: Fall; pain. FINDINGS: BRAIN/VENTRICLES: There is no acute intracranial hemorrhage, mass  effect or midline shift.  No abnormal extra-axial fluid collection.  The gray-white differentiation is maintained without evidence of an acute infarct.  There is no evidence of hydrocephalus. ORBITS: The visualized portion of the orbits demonstrate no acute abnormality. SINUSES: The visualized paranasal sinuses and mastoid air cells demonstrate no acute abnormality. SOFT TISSUES/SKULL:  No acute abnormality of the visualized skull or soft tissues.     No acute intracranial abnormality.       LABS:  No results found for this visit on 03/11/23.    EMERGENCY DEPARTMENT COURSE           Vitals:    Vitals:    03/11/23 1353   BP: 128/77   Pulse: 82   Resp: 18   Temp: 99.1 F (37.3 C)   TempSrc: Oral   SpO2: 98%   Weight: 98 kg (216 lb 0.8 oz)   Height: 1.727 m (5' 7.99")     -------------------------  BP: 128/77, Temp: 99.1 F (37.3 C), Pulse: 82, Respirations: 18      RE-EVALUATION:  See ED Course notes above.    The patient and/or family and I have discussed the diagnosis and risks, and we agree with discharging home to follow-up with their pertinent providers. The patient appears stable for discharge and has been instructed to return immediately for new concerning symptoms or if the symptoms worsen in any way. The patient understands that at this time there is no evidence for a more malignant underlying process, but the patient also understands that early in the process of an illness or injury, an emergency department workup can be falsely reassuring. Routine discharge counseling was given, and the patient understands that worsening, changing or persistent symptoms should prompt an immediate call or follow up with their primary physician or return to the emergency department.    I have reviewed the disposition diagnosis with the patient and or their family/guardian. I have answered their questions and given discharge instructions. They voiced understanding of these instructions and did not have any further questions  or complaints.     This patient was seen by the attending physician and they agreed with the assessment and plan.    CONSULTS:  None    PROCEDURES:  None    FINAL IMPRESSION      1. Strain of neck muscle, subsequent encounter          DISPOSITION / PLAN     CONDITION ON DISPOSITION:   Good / Stable for discharge. home    PATIENT REFERRED [place]  [address]  [place] [address]  [phone]    Call   As needed      DISCHARGE MEDICATIONS:  New Prescriptions    CYCLOBENZAPRINE (FLEXERIL) 10 MG TABLET    Take 1 tablet by mouth 3 times daily as needed for Muscle spasms    HYDROCODONE-ACETAMINOPHEN (NORCO) 5-325 MG PER TABLET    Take 1 tablet by mouth every 6 hours as needed for Pain for up to 3 days. Intended supply: 3 days. Take lowest dose possible to manage pain Max Daily Amount: 4 tablets       Mayo Ao, APRN - CNP   Emergency Medicine Physician Assistant    (Please note that portions of this note were completed with a voice recognition program.  Efforts were made to edit the dictations but occasionally words aremis-transcribed.)       Mayo Ao, APRN - CNP  03/11/23 1416

## 2023-03-11 NOTE — Discharge Instructions (Signed)
Use heat application, gentle neck stretches.  You may call your employers HR department to get Worker's Comp. referral or you could follow-up with any occupational health clinic near you.  Take medications as prescribed the Norco and Flexeril may make you drowsy no drinking alcohol or driving while taking them.

## 2023-03-11 NOTE — ED Provider Notes (Incomplete)
[facility] Emergency Department    [phone] [facility] [address]  [facility] [place] [address]  Phone: [phone]  Fax: [phone]  Emergency Department  Faculty Attestation    I performed a history and physical examination of the patient and discussed management with the mid level provider. I reviewed the mid level provider's note and agree with the documented findings and plan of care. Any areas of disagreement are noted on the chart. I was personally present for the key portions of any procedures. I have documented in the chart those procedures where I was not present during the key portions. I have reviewed the emergency nurses triage note. I agree with the chief complaint, past medical history, past surgical history, allergies, medications, social and family history as documented unless otherwise noted below. Documentation of the HPI, Physical Exam and Medical Decision Making performed by medical students or scribes is based on my personal performance of the HPI, PE and MDM. For Physician Assistant/ Nurse Practitioner cases/documentation I have personally evaluated this patient and have completed at least one if not all key elements of the E/M (history, physical exam, and MDM). Additional findings are as noted.      Primary Care Physician:  Doumet, Lucy Chris, MD    CHIEF COMPLAINT       Chief Complaint   Patient presents with    Neck Pain     Work related injury from about 1 week ago.  Pt complains of persisting head and neck pains and inability to lift, push, pull and is having uncontrolled pain.  Shoulder pain, neck pain, back pain.        RECENT VITALS:   Temp: 99.1 F (37.3 C),  Pulse: 82, Respirations: 18, BP: 128/77    LABS:  Labs Reviewed - No data to display      PERTINENT ATTENDING PHYSICIAN COMMENTS:    ***     The patient presents with neck pain.  He was seen a week ago and referred to [employer] but has not been able to find a provider    On exam, patient has minimal pain to  palpation.  He has full sensorimotor function of his upper extremities.    I offered the patient referral to a Worker's Compensation provider that was not given to him before.  He will try to call them.  I offered referral to 1 out of [place] and he prefers to stay in [place].  He was told he can follow-up with any provider who provide takes [employer].  He is discharged in good condition.

## 2023-12-30 ENCOUNTER — Inpatient Hospital Stay
Admission: EM | Admit: 2023-12-30 | Discharge: 2023-12-30 | Disposition: A | Discharge status: Home / Self Care | Payer: PRIVATE HEALTH INSURANCE | Admitting: Emergency Medicine

## 2023-12-30 DIAGNOSIS — J069 Acute upper respiratory infection, unspecified: Secondary | ICD-10-CM

## 2023-12-30 LAB — CBC WITH AUTO DIFFERENTIAL
Basophils %: 1 % (ref 0–2)
Basophils Absolute: 0.08 10*3/uL (ref 0.00–0.20)
Eosinophils %: 2 % (ref 1–4)
Eosinophils Absolute: 0.19 10*3/uL (ref 0.00–0.44)
Hematocrit: 42.3 % (ref 40.7–50.3)
Hemoglobin: 15.2 g/dL (ref 13.0–17.0)
Immature Granulocytes %: 0 %
Immature Granulocytes Absolute: 0.04 10*3/uL (ref 0.00–0.30)
Lymphocytes %: 16 % — ABNORMAL LOW (ref 24–43)
Lymphocytes Absolute: 2.05 10*3/uL (ref 1.10–3.70)
MCH: 32.2 pg (ref 25.2–33.5)
MCHC: 35.9 g/dL — ABNORMAL HIGH (ref 28.4–34.8)
MCV: 89.6 fL (ref 82.6–102.9)
MPV: 10 fL (ref 8.1–13.5)
Monocytes %: 10 % (ref 3–12)
Monocytes Absolute: 1.25 10*3/uL — ABNORMAL HIGH (ref 0.10–1.20)
NRBC Automated: 0 /100{WBCs}
Neutrophils %: 71 % — ABNORMAL HIGH (ref 36–65)
Neutrophils Absolute: 9.23 10*3/uL — ABNORMAL HIGH (ref 1.50–8.10)
Platelets: 208 10*3/uL (ref 138–453)
RBC: 4.72 m/uL (ref 4.21–5.77)
RDW: 12.7 % (ref 11.8–14.4)
WBC: 12.8 10*3/uL — ABNORMAL HIGH (ref 3.5–11.3)

## 2023-12-30 LAB — COMPREHENSIVE METABOLIC PANEL
ALT: 43 U/L (ref 10–50)
AST: 25 U/L (ref 10–50)
Albumin/Globulin Ratio: 1.4 (ref 1.0–2.5)
Albumin: 4.3 g/dL (ref 3.5–5.2)
Alkaline Phosphatase: 118 U/L (ref 40–129)
Anion Gap: 14 mmol/L (ref 9–16)
BUN: 11 mg/dL (ref 6–20)
CO2: 23 mmol/L (ref 20–31)
Calcium: 9.3 mg/dL (ref 8.6–10.4)
Chloride: 102 mmol/L (ref 98–107)
Creatinine: 0.8 mg/dL (ref 0.7–1.2)
Est, Glom Filt Rate: >90 mL/min/{1.73_m2} (ref >60)
Glucose: 199 mg/dL — ABNORMAL HIGH (ref 74–99)
Potassium: 3.9 mmol/L (ref 3.7–5.3)
Sodium: 139 mmol/L (ref 136–145)
Total Bilirubin: 1.1 mg/dL (ref 0.0–1.2)
Total Protein: 7.3 g/dL (ref 6.6–8.7)

## 2023-12-30 LAB — COVID-19, RAPID
SARS-CoV-2, Rapid: UNDETERMINED — AB
SARS-CoV-2, Rapid: NOT DETECTED

## 2023-12-30 LAB — MAGNESIUM: Magnesium: 1.8 mg/dL (ref 1.6–2.6)

## 2023-12-30 LAB — TROPONIN
Troponin, High Sensitivity: 6 ng/L (ref 0–22)
Troponin, High Sensitivity: 7 ng/L (ref 0–22)

## 2023-12-30 LAB — RAPID INFLUENZA A/B ANTIGENS
Flu A Antigen: NEGATIVE
Flu B Antigen: NEGATIVE

## 2023-12-30 MED ORDER — ALBUTEROL SULFATE (2.5 MG/3ML) 0.083% IN NEBU
RESPIRATORY_TRACT | Status: DC | PRN
Start: 2023-12-30 — End: 2023-12-30
  Administered 2023-12-30: 14:00:00 2.5 mg via RESPIRATORY_TRACT

## 2023-12-30 MED ORDER — PREDNISONE 20 MG PO TABS
20 | Freq: Once | ORAL | Status: AC
Start: 2023-12-30 — End: 2023-12-30
  Administered 2023-12-30: 14:00:00 60 mg via ORAL

## 2023-12-30 MED ORDER — ALBUTEROL SULFATE HFA 108 (90 BASE) MCG/ACT IN AERS
108 | Freq: Four times a day (QID) | RESPIRATORY_TRACT | 1 refills | Status: AC | PRN
Start: 2023-12-30 — End: ?

## 2023-12-30 MED ORDER — PREDNISONE 10 MG PO TABS
10 | ORAL_TABLET | ORAL | 0 refills | Status: AC
Start: 2023-12-30 — End: 2024-01-09

## 2023-12-30 MED ORDER — PREDNISONE 10 MG PO TABS
10 | ORAL_TABLET | ORAL | 0 refills | Status: DC
Start: 2023-12-30 — End: 2023-12-30

## 2023-12-30 MED FILL — PREDNISONE 20 MG PO TABS: 20 MG | ORAL | Qty: 3

## 2023-12-30 MED FILL — ALBUTEROL SULFATE (2.5 MG/3ML) 0.083% IN NEBU: RESPIRATORY_TRACT | Qty: 3

## 2023-12-30 NOTE — Progress Notes (Signed)
 I signed up for this patient in error. I did not see this patient. I did not participate in the evlauation or treatment of this patient.    Electronically signed by Hennie Duos, DO on 12/30/2023 at 9:02 AM

## 2023-12-30 NOTE — ED Provider Notes (Signed)
 [facility]  Emergency Department  Faculty Attestation     I performed a history and physical examination of the patient and discussed management with the resident. I reviewed the resident's note and agree with the docume

## 2023-12-30 NOTE — ED Notes (Signed)
 Pt to xray

## 2023-12-30 NOTE — ED Notes (Signed)
 Pt ambulated to restroom with steady gait.

## 2023-12-30 NOTE — Progress Notes (Signed)
 BRONCHOSPASM/BRONCHOCONSTRICTION     [x]          IMPROVE AERATION/BREATH SOUNDS  [x]    ADMINISTER BRONCHODILATOR THERAPY AS APPROPRIATE  [x]    ASSESS BREATH SOUNDS  [x]    IMPLEMENT AEROSOL/MDI PROTOCOL  [x]    PATIENT EDUCATION AS NEEDED

## 2023-12-30 NOTE — Discharge Instructions (Signed)
 You were seen today for an asthma exacerbation and sore throat.  You likely have a viral illness.  I recommend Tylenol Motrin as needed for sore throat and bodyaches chills fever.  Please use your albuterol inhaler.  I have prescribed you a new 1.  I have

## 2023-12-30 NOTE — ED Provider Notes (Signed)
 [facility] EMERGENCY DEPARTMENT  Emergency Department Encounter  Emergency Medicine Resident     Pt Name:Daniel Hensley  MRN: [id number]  Birthdate February 01, 1966  Date of evaluation: 12/30/23  PCP:  Darletta Moll, MD  Note Started: 9:02 AM EST

## 2023-12-31 LAB — EKG 12-LEAD
Atrial Rate: 101 {beats}/min
P Axis: 47 degrees
P-R Interval: 126 ms
Q-T Interval: 326 ms
QRS Duration: 74 ms
QTc Calculation (Bazett): 422 ms
R Axis: 27 degrees
T Axis: 55 degrees
Ventricular Rate: 101 {beats}/min

## 2024-10-27 ENCOUNTER — Inpatient Hospital Stay: Admit: 2024-10-27 | Payer: Medicare (Managed Care) | Primary: Internal Medicine

## 2024-10-27 VITALS — BP 119/70 | HR 79 | Temp 97.20000°F | Resp 18 | Ht 68.0 in | Wt 234.0 lb

## 2024-10-27 DIAGNOSIS — Z01812 Encounter for preprocedural laboratory examination: Secondary | ICD-10-CM

## 2024-10-27 DIAGNOSIS — Z01818 Encounter for other preprocedural examination: Principal | ICD-10-CM

## 2024-10-27 NOTE — Pre-Procedure Instructions (Signed)
 "DAY OF SURGERY/PROCEDURE  GUIDELINES    As a patient at [facility], you can expect quality medical and nursing care that is centered on your individual needs. It is our goal to make your surgical experience as comfortable and excellent as possible.  ________________________________________________________________________    The following instructions are general guidelines, if any information on this sheet is different from what your doctor has instructed you to do, please follow your doctor's instructions.    Please arrive on 11/05/24 @ 5:45am      Enter through main entrance. Check in at surgery registration 2nd floor.    Upon arrival you will be taken to the pre-operative area to get ready for surgery, your family will stay in the waiting room and visit with you once you are ready for surgery. Due to special limitations please limit visitation to 1-2 members of your family at a time. When it is time for surgery your family will return to the waiting room.    Please stop eating all foods, and all other beverages or drinks after midnight prior to your surgery/procedure.    Staying hydrated is important, you may drink sips of water  up to 2 hours prior to your arrival time at the hospital.     Certain procedures may require dietary instructions such as a clear liquid diet the day before your procedure. If you receive special dietary instructions from your physician performing your procedure, please follow these instructions and contact the office if you have questions.    Do not smoke or chew after midnight (no gum, mints, candy, cigarettes, cigars, pipes, snuff, chewing tobacco, vaping, marijuana, etc.) or your surgery may be canceled.     Please hold marijuana use x 48 hours prior to day of surgery. No alcohol 24 hours prior to surgery.    Take a small sip of water  with escitalopram, truvada.    DO NOT take anticoagulants (blood thinners, ibuprofen, Motrin, NSAIDS, aspirin or aspirin-containing  products) as instructed by your physician.    DO NOT take any diabetic pills or insulin morning of your surgery.     Hold Farxiga 3 days prior to your surgery.    If you are taking a GLP-1 medication (Ozempic, Wegovy, Rybelsus, Mounjaro, etc.) and experience any abdominal symptoms (nausea, vomiting, diarrhea, abdominal pain, reflux, indigestion, bloating) please follow a clear liquid diet the day prior to your procedure. If you take the drug daily in pill form do not take it on the day of your surgery/procedure. If you take the drug weekly as an injection, stop taking it a week prior to your surgery/procedure.    If applicable bring your:  Inhaler (s)  Hearing aid(s)  Eyeglasses and Case (If you wear contacts they have to be removed before surgery, bring case and solution)  CPAP/Bipap  Portable oxygen tank or concentrator from home    Take a shower or bath the night prior to and on the morning of your surgery/procedure (Hibiclens if directed) Do not apply any lotions.    Brush your teeth, but do not swallow any water     Leave all jewelry at home and wear loose, comfortable clothing that is easy to put on and take off.     Do not wear makeup or nail polish and remove all body piercing's prior to arrival to the hospital.    IN CASE OF ILLNESS - If you have a cold or flu symptoms (high fever, runny nose, sore throat, cough, etc.) rash,  nausea, vomiting, loose stools, and/or recent contact with someone who has a contagious disease (chick pox, measles, etc.) please call your doctor before coming to the surgery center    If you will be returning home the same day as your surgery, you will need to have a responsible adult (38 years of age or older) present to drive you home or accompany you if you are taking a taxi. You will need someone to stay at the hospital during your surgery and with you at home for the first 24 hours following your surgery. This is due to the anesthesia and the medication given to you during surgery  and recovery.      "

## 2024-10-27 NOTE — Periop Note (Signed)
"  Call received from [facility] office stating pt calling to clarify which meds to take day of surgery. Called pt and left voicemail instructing pt to hold farxiga 3 days prior to surgery, hold ozempic 7 days prior to surgery and to take escitalopram and truvada the morning of surgery. Left callback number for pt.    "

## 2024-10-28 LAB — EKG 12-LEAD
Atrial Rate: 74 {beats}/min
P Axis: 47 degrees
P-R Interval: 130 ms
Q-T Interval: 378 ms
QRS Duration: 84 ms
QTc Calculation (Bazett): 419 ms
R Axis: 1 degrees
T Axis: 30 degrees
Ventricular Rate: 74 {beats}/min

## 2024-10-28 LAB — BASIC METABOLIC PANEL
Anion Gap: 8 mmol/L — ABNORMAL LOW (ref 9–16)
BUN: 11 mg/dL (ref 6–20)
CO2: 30 mmol/L (ref 20–31)
Calcium: 9.4 mg/dL (ref 8.6–10.4)
Chloride: 101 mmol/L (ref 98–107)
Creatinine: 0.8 mg/dL (ref 0.7–1.2)
Est, Glom Filt Rate: >90 mL/min/1.73m2 (ref >60)
Glucose: 192 mg/dL — ABNORMAL HIGH (ref 74–99)
Potassium: 4.1 mmol/L (ref 3.7–5.3)
Sodium: 139 mmol/L (ref 136–145)

## 2024-10-28 LAB — CBC
Hematocrit: 42.1 % (ref 40.7–50.3)
Hemoglobin: 14.5 g/dL (ref 13.0–17.0)
MCH: 31.9 pg (ref 25.2–33.5)
MCHC: 34.4 g/dL (ref 28.4–34.8)
MCV: 92.5 fL (ref 82.6–102.9)
MPV: 10.7 fL (ref 8.1–13.5)
NRBC Automated: 0 /100{WBCs}
Platelets: 212 k/uL (ref 138–453)
RBC: 4.55 m/uL (ref 4.21–5.77)
RDW: 13.8 % (ref 11.8–14.4)
WBC: 6.3 k/uL (ref 3.5–11.3)

## 2024-10-28 LAB — HEMOGLOBIN A1C
Estimated Avg Glucose: 157 mg/dL
Hemoglobin A1C: 7.1 % — ABNORMAL HIGH (ref 4.0–6.0)

## 2024-11-05 ENCOUNTER — Inpatient Hospital Stay: Payer: Medicare (Managed Care) | Attending: Podiatrist

## 2024-11-05 ENCOUNTER — Ambulatory Visit: Admit: 2024-11-05 | Payer: Medicare (Managed Care) | Primary: Internal Medicine

## 2024-11-05 LAB — POC GLUCOSE FINGERSTICK
POC Glucose: 265 mg/dL — ABNORMAL HIGH (ref 75–110)
POC Glucose: 191 mg/dL — ABNORMAL HIGH (ref 75–110)

## 2024-11-05 MED ORDER — PROCHLORPERAZINE EDISYLATE 10 MG/2ML IJ SOLN
10 | Freq: Once | INTRAMUSCULAR | Status: DC | PRN
Start: 2024-11-05 — End: 2024-11-05

## 2024-11-05 MED ORDER — LIDOCAINE HCL (PF) 2 % IJ SOLN
2 | Freq: Once | INTRAMUSCULAR | Status: DC | PRN
Start: 2024-11-05 — End: 2024-11-05
  Administered 2024-11-05: 13:00:00 60 via INTRAVENOUS

## 2024-11-05 MED ORDER — LIDOCAINE HCL 1 % IJ SOLN
1 | INTRAMUSCULAR | Status: AC
Start: 2024-11-05 — End: 2024-11-05

## 2024-11-05 MED ORDER — NORMAL SALINE FLUSH 0.9 % IV SOLN
0.9 | Freq: Two times a day (BID) | INTRAVENOUS | Status: DC
Start: 2024-11-05 — End: 2024-11-05

## 2024-11-05 MED ORDER — BUPIVACAINE HCL (PF) 0.5 % IJ SOLN
0.5 | INTRAMUSCULAR | Status: AC
Start: 2024-11-05 — End: 2024-11-05

## 2024-11-05 MED ORDER — MEPERIDINE HCL 25 MG/ML IJ SOLN
25 | INTRAMUSCULAR | Status: DC | PRN
Start: 2024-11-05 — End: 2024-11-05

## 2024-11-05 MED ORDER — SODIUM CHLORIDE 0.9 % IV SOLN
0.9 | INTRAVENOUS | Status: DC | PRN
Start: 2024-11-05 — End: 2024-11-05

## 2024-11-05 MED ORDER — SODIUM CHLORIDE (PF) 0.9 % IJ SOLN
0.9 | INTRAMUSCULAR | Status: DC | PRN
Start: 2024-11-05 — End: 2024-11-05

## 2024-11-05 MED ORDER — KETAMINE HCL-SODIUM CHLORIDE 50-0.9 MG/5ML-% IV SOSY
50-0.9 | INTRAVENOUS | Status: AC
Start: 2024-11-05 — End: 2024-11-05

## 2024-11-05 MED ORDER — HYDROCODONE-ACETAMINOPHEN 5-325 MG PO TABS
5-325 | ORAL_TABLET | ORAL | 0 refills | Status: AC | PRN
Start: 2024-11-05 — End: 2024-11-10

## 2024-11-05 MED ORDER — LIDOCAINE HCL (PF) 1 % IJ SOLN
1 | Freq: Once | INTRAMUSCULAR | Status: DC | PRN
Start: 2024-11-05 — End: 2024-11-05

## 2024-11-05 MED ORDER — LIDOCAINE HCL (PF) 1 % IJ SOLN
1 | INTRAMUSCULAR | Status: DC | PRN
Start: 2024-11-05 — End: 2024-11-05
  Administered 2024-11-05: 13:00:00 10 via INTRADERMAL

## 2024-11-05 MED ORDER — FENTANYL CITRATE (PF) 100 MCG/2ML IJ SOLN
100 | Freq: Once | INTRAMUSCULAR | Status: DC | PRN
Start: 2024-11-05 — End: 2024-11-05
  Administered 2024-11-05: 13:00:00 25 via INTRAVENOUS

## 2024-11-05 MED ORDER — OXYCODONE HCL 5 MG PO TABS
5 | Freq: Once | ORAL | Status: DC | PRN
Start: 2024-11-05 — End: 2024-11-05

## 2024-11-05 MED ORDER — NORMAL SALINE FLUSH 0.9 % IV SOLN
0.9 | INTRAVENOUS | Status: DC | PRN
Start: 2024-11-05 — End: 2024-11-05

## 2024-11-05 MED ORDER — PROPOFOL 1000 MG/100ML IV EMUL
1000 | INTRAVENOUS | Status: DC | PRN
Start: 2024-11-05 — End: 2024-11-05
  Administered 2024-11-05 (×2): 150 via INTRAVENOUS

## 2024-11-05 MED ORDER — MIDAZOLAM HCL 2 MG/2ML IJ SOLN
2 | INTRAMUSCULAR | Status: AC
Start: 2024-11-05 — End: 2024-11-05

## 2024-11-05 MED ORDER — METOCLOPRAMIDE HCL 5 MG/ML IJ SOLN
5 | Freq: Once | INTRAMUSCULAR | Status: DC | PRN
Start: 2024-11-05 — End: 2024-11-05

## 2024-11-05 MED ORDER — PROPOFOL 1000 MG/100ML IV EMUL
1000 | INTRAVENOUS | Status: AC
Start: 2024-11-05 — End: 2024-11-05

## 2024-11-05 MED ORDER — SODIUM CHLORIDE 0.9 % IV SOLN
0.9 | INTRAVENOUS | Status: DC
Start: 2024-11-05 — End: 2024-11-05

## 2024-11-05 MED ORDER — DIPHENHYDRAMINE HCL 50 MG/ML IJ SOLN
50 | Freq: Once | INTRAMUSCULAR | Status: DC | PRN
Start: 2024-11-05 — End: 2024-11-05

## 2024-11-05 MED ORDER — FENTANYL CITRATE (PF) 100 MCG/2ML IJ SOLN
100 | INTRAMUSCULAR | Status: DC | PRN
Start: 2024-11-05 — End: 2024-11-05

## 2024-11-05 MED ORDER — HYDROMORPHONE HCL PF 1 MG/ML IJ SOLN
1 | INTRAMUSCULAR | Status: DC | PRN
Start: 2024-11-05 — End: 2024-11-05

## 2024-11-05 MED ORDER — LIDOCAINE HCL (PF) 2 % IJ SOLN
2 | INTRAMUSCULAR | Status: AC
Start: 2024-11-05 — End: 2024-11-05

## 2024-11-05 MED ORDER — LACTATED RINGERS IV SOLN
INTRAVENOUS | Status: DC
Start: 2024-11-05 — End: 2024-11-05
  Administered 2024-11-05: 12:00:00 via INTRAVENOUS

## 2024-11-05 MED ORDER — MIDAZOLAM HCL 2 MG/2ML IJ SOLN
2 | Freq: Once | INTRAMUSCULAR | Status: DC | PRN
Start: 2024-11-05 — End: 2024-11-05
  Administered 2024-11-05: 13:00:00 1 via INTRAVENOUS

## 2024-11-05 MED ORDER — FENTANYL CITRATE (PF) 100 MCG/2ML IJ SOLN
100 | INTRAMUSCULAR | Status: AC
Start: 2024-11-05 — End: 2024-11-05

## 2024-11-05 MED ORDER — KETAMINE HCL-SODIUM CHLORIDE 50-0.9 MG/5ML-% IV SOSY
50-0.9 | Freq: Once | INTRAVENOUS | Status: DC | PRN
Start: 2024-11-05 — End: 2024-11-05
  Administered 2024-11-05: 13:00:00 10 via INTRAVENOUS

## 2024-11-05 MED ORDER — CEFAZOLIN 2000 MG IN 20 ML SWFI IV SYRINGE (PREMIX)
Freq: Once | Status: AC
Start: 2024-11-05 — End: 2024-11-05
  Administered 2024-11-05: 13:00:00 2000 mg via INTRAVENOUS

## 2024-11-05 MED FILL — KETAMINE HCL-SODIUM CHLORIDE 50-0.9 MG/5ML-% IV SOSY: 50-0.9 MG/5ML-% | INTRAVENOUS | Qty: 5

## 2024-11-05 MED FILL — XYLOCAINE-MPF 2 % IJ SOLN: 2 % | INTRAMUSCULAR | Qty: 5

## 2024-11-05 MED FILL — FENTANYL CITRATE (PF) 100 MCG/2ML IJ SOLN: 100 MCG/2ML | INTRAMUSCULAR | Qty: 2

## 2024-11-05 MED FILL — CEFAZOLIN 2000 MG IN 20 ML SWFI IV SYRINGE (PREMIX): Qty: 2000

## 2024-11-05 MED FILL — XYLOCAINE 1 % IJ SOLN: 1 % | INTRAMUSCULAR | Qty: 20

## 2024-11-05 MED FILL — BUPIVACAINE HCL (PF) 0.5 % IJ SOLN: 0.5 % | INTRAMUSCULAR | Qty: 30

## 2024-11-05 MED FILL — DIPRIVAN 1000 MG/100ML IV EMUL: 1000 MG/100ML | INTRAVENOUS | Qty: 100

## 2024-11-05 MED FILL — MIDAZOLAM HCL 2 MG/2ML IJ SOLN: 2 mg/mL | INTRAMUSCULAR | Qty: 2

## 2024-11-05 NOTE — Brief Op Note (Signed)
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 Item Serial No. Manufacturer Lot No. LRB No. Used Action   GRAFT BONE SUB 2.5CC ALLOSYNC PURE - [id number]  GRAFT BONE SUB 2.5CC ALLOSYNC PURE [id number] [employer]  Left 1 Implanted       Hemostatic Agents/Irrigation Solution (excluding Saline & Sterile Water  Irrigation):  No hemostatic agent or irrigation used        Drains: * No LDAs found *    Findings:  Present At Time Of Surgery (PATOS) (choose all levels that have infection present):  No infection present  Other Findings: 1st MPJ shows signs of fusion.  No motion at the MPJ when observed under c arm.  Dorsal plate removed. One broken screw within plate.  Backfilled with DBM, closed with PDS, monocryl, prolene.    Electronically signed by Gregory LABOR Ephriam, DPM on 11-20-2024 at 9:16 AM  "

## 2024-11-05 NOTE — Discharge Instructions (Addendum)
 "Foot and Ankle Surgery Post Operative Instructions:  You have had a surgical procedure on your left foot    Fluids and Diet:  Begin with clear liquids, broth, dry toast, and crackers.  If not nauseated then resume your regular pre-operative diet when you are ready  With your history of diabetes, please lower your intake sugar content food as this will negatively impact surgery.    Medications:  Take your prescriptions as directed  You are receiving new prescriptions for Norco; take as directed.  You received nerve block to the foot-this should manage your pain for the first 18hrs post operatively  If your pain is not severe then you may take the non-prescription medication that you normally take for aches and pains ie Tylenol  and Ibuprofen (alternating), or if severe pain occurs these will serve as additional medication   You may resume your regularly scheduled medications (unless otherwise directed)  If you have a fracture or a surgery that involves placing hardware (screws, plates, joint replacement), avoid the routine use of NSAIDs for about 6 months if possible (due to a risk of delayed bone healing).  If any side effects or adverse reactions occur, discontinue the medication and contact your doctor.  Review the patient drug information that is provided before you take any medication    Ambulation and Activity:  You are advised to go directly home from the hospital  Use assistance device with either crutches, walker, or knee scooter  We recommend knee scooter if possible, you can also obtain these on [employer] for rather affordable and quickly obtainable.  You may put weight on the operated foot.  You should wear the surgical shoe at all times when awake.   Avoid stairs.  Do not lift or move heavy objects  Do not drive until cleared by your physician    Bandage and Wound Care Instructions:  Keep bandage clean and dry  Do NOT remove dressing/ splint  DO NOT get wet  Please use shower cover around leg if you do  shower so that dressing does not get wet  Do not shower or bathe the operative extremity  Do not remove the bandage (unless otherwise directed)   Do not attempt to put anything between the cast or dressing and your skin, some itching is normal.    Ice and Elevation:   Elevate operative extremity as much as possible to reduce swelling and discomfort.  Elevate with 2 pillows at or above the level of the heart for the first 72 hours. This is important for post operative swelling and pain  Ice:  Apply Hospital dispensed insulated ice bag behind the knee and atop the bandage 20 minutes of every hour while awake for the first 72 hours.  You may only ice behind the knee as well.    Special Instructions: Call your doctor immediately if you develop any of the following.  Fever over 100.4 degrees Fahrenheit by mouth - take your temperature daily until your first follow up visit.  Pain not relieved by medication ordered  Swelling, increased redness, warmth, or hardness around operative area.  Numb, tingling or cold toes.  Toe(s) become white or bluish  Bandage becomes wet, soiled, or blood soaked (small amount of bleeding may be normal)  Increased or progressive drainage from surgical area.    Follow up instructions:  You will need to follow up with Dr. Netta, Cordella LABOR, DPM within 7-10 days post operatively  Call when you get home to schedule or  confirm your appointment.  Call your surgeons office if you have any questions or concerns.  "

## 2024-11-05 NOTE — Anesthesia Postprocedure Evaluation (Signed)
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])    Surgeons: Netta Cordella LABOR, DPM Responsible Provider: Lang Delinda ORN, DO    Anesthesia Type: MAC ASA Status: 3            Anesthesia Type: MAC    Aldrete Phase I: Aldrete Score: 10    Aldrete Phase II: Aldrete Score: 10    Anesthesia Post Evaluation    Patient location during evaluation: PACU  Patient participation: complete - patient participated  Level of consciousness: awake and alert  Airway patency: patent  Nausea & Vomiting: no nausea and no vomiting  Cardiovascular status: hemodynamically stable  Respiratory status: acceptable  Hydration status: stable  Pain management: adequate      No notable events documented.  "

## 2024-11-05 NOTE — Op Note (Signed)
 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 Item Serial No. Manufacturer Lot No. LRB No. Used Action   GRAFT BONE SUB 2.5CC ALLOSYNC PURE - [id number]  GRAFT BONE SUB 2.5CC ALLOSYNC PURE [id number] [employer]  Left 1 Implanted       Hemostatic Agents/Irrigation Solution (excluding Saline & Sterile Water  Irrigation):  No hemostatic agent or irrigation used      Drains: * No LDAs found *    Findings:  Present At Time Of Surgery (PATOS) (choose all levels that have infection present):  No infection present  Other Findings: 1st MPJ shows signs of fusion. No motion at the MPJ when observed under c arm. Dorsal plate removed. One broken screw within plate. Backfilled with DBM, closed with PDS, monocryl, prolene.     Detailed Description of Procedure:        INDICATIONS FOR PROCEDURES: The patient is a 58 year old male who has been has developed increasing pain to the left foot at the level of 1st mpj due to failing hardware. Initial procedure was 1st mpj arthrodesis performed in 2023 by outside provider. In sequela to the painful hardware the patient long been suspicious of incomplete fusion. Patient preoperative xrays show broken hardware present, at least one broken interfrag screw. Pain has been progressing over the past months to the point of limiting ambulation and negatively impacting activities of daily living. The patient has failed multiple conservative treatment  modalities. At this time the patient elects to undergo surgery to revise 1st metatarsophalangeal joint arthrodesis. All risks and benefits were discussed, no guarantees were given or implied. Consent was obtained and placed in the patient's chart. COVID testing negative.     PROCEDURE IN DETAIL: Under mild sedation the patient was transported from pre-op to the operating room and placed on the operating table in the supine position with a safety strap across the lap. A pneumatic tourniquet was applied to the left thigh. After adequate sedation by the Anesthesia, a local block of 20cc of 0.5% Marcaine  plain was injected. The foot was then scrubbed, prepped, and draped in the usual aseptic fashion.  A timeout was performed confirming the correct patient, correct site, correct procedure, preoperative antibiotics, everyone in the room was in agreement. The foot was then exsanguinated with an Esmarch bandage and the pneumatic tourniquet was inflated to where it would remain for 70 throughout the procedure.     Attention was directed to the dorsal medial aspect of the left foot where a cicatrix was noted.  A dorsal medial incision was created over the first metatarsophalangeal joint with a #15 blade. The incision was then deepened utilizing sharp dissection until the hardware was visible.There was extensive scar tissue formation throughout all tissue planes due to previous surgical intervention. There was  also very apparent osteophyte formation and bony overgrowth throughout the 1st metatarsalphalangeal joint.  All adhesions around the hardware were freed. All bony prominences were removed with an osteotome/mallet and a rongeur.  All screw heads were cleared of overlying tissue. A screw driver was utilized to remove the screws in a distal to proximal manner. The most proximal lateral screw head was noted to be stripped and without threads to engage the screw driver. One screw within the plate was observed to be  broken at the level of the runout of the screw.  Head was very prominent within the soft tissue/tendons. After all hardware was removed there was noted to be stable complete osseous fusion of the 1st mpj.  After several attempts to stress on c-arm there was noted to be complete osseous unioon of the arthrodesis site, and plan to revise was deemed unneccessary as the source of pain was the broken/prominent hardware. The surgical site was then irrigated with copious amounts of sterile saline. The osseous deficit from the final screw removal was filled with allosync graft.      The surgical site was then closed in layers utilizing 2-0 PDS for deep closure, 3-0 monocryl and 3-0 prolene for skin closure.     Dressings consisted of adaptic, 4 x 4s, Kerlix and an ACE bandage were applied. The pneumatic ankle tourniquet was released and immediate hyperemic flush was noted to the left foot. The patient tolerated the above procedure and anesthesia well without complications. The patient was transported from the operating room to the PACU with vital signs stable and neuro-vascular status intact to the foot.             Electronically signed by Gregory DELENA Pa, DPM on 11/06/2024 at 7:14 AM    "

## 2024-11-05 NOTE — Anesthesia Pre Procedure (Signed)
 "Department of Anesthesiology  Preprocedure Note       Name:  Daniel Hensley   Age:  58 y.o.  DOB:  [birth year]                                          MRN:  [id number]         Date:  11/05/2024      Surgeon: Clotilde):  Black, Cordella LABOR, DPM    Procedure: Procedure(s):  LEFT FOOT 1ST MPJ REVISION ARTHRODESIS    Medications prior to admission:   Prior to Admission medications   Medication Sig Start Date End Date Taking? Authorizing Provider   Lumateperone Tosylate (CAPLYTA) 21 MG capsule Take 1 capsule by mouth daily   Yes [provider]   amLODIPine (NORVASC) 5 MG tablet Take 1 tablet by mouth daily   Yes [provider]   naproxen (NAPROSYN) 500 MG tablet Take 1 tablet by mouth daily   Yes [provider]   omeprazole (PRILOSEC) 20 MG delayed release capsule Take 1 capsule by mouth daily   Yes [provider]   potassium chloride (MICRO-K) 10 MEQ extended release capsule Take 1 capsule by mouth daily   Yes [provider]   escitalopram (LEXAPRO) 10 MG tablet Take 1 tablet by mouth daily   Yes [provider]   Baclofen (LIORESAL) 5 MG tablet Take 1 tablet by mouth daily 10/15/24  Yes [provider]   Vitamin D3 125 MCG (5000 UT) TABS tablet Take 1 tablet by mouth daily   Yes [provider]   albuterol  sulfate HFA (VENTOLIN  HFA) 108 (90 Base) MCG/ACT inhaler Inhale 2 puffs into the lungs 4 times daily as needed for Wheezing 12/30/23  Yes Chryl Krabbe, DO   hydrOXYzine HCl (ATARAX) 10 MG tablet Take 1 tablet by mouth every 8 hours as needed 02/06/23  Yes [provider]   emtricitabine-tenofovir (TRUVADA) 200-300 MG per tablet Take 1 tablet by mouth daily 04/09/22  Yes [provider]   atorvastatin (LIPITOR) 40 MG tablet Take 1 tablet by mouth daily 02/06/23  Yes [provider]   Semaglutide,0.25 or 0.5MG /DOS, (OZEMPIC, 0.25 OR 0.5 MG/DOSE,) 2 MG/3ML SOPN Inject 0.5 mg into the skin once a week Sundays 02/22/23    [provider]   Asenapine (SECUADO) 5.7 MG/24HR PT24 Apply 1 patch topically daily  Patient not taking: Reported on 10/27/2024 01/26/22   [provider]       Current medications:    Current Facility-Administered Medications   Medication Dose Route Frequency Provider Last Rate Last Admin    [START ON 11/06/2024] lidocaine  PF 1 % injection 1 mL  1 mL IntraDERmal Once PRN Sobhanie, Mohammad-Safa E, MD        0.9 % sodium chloride  infusion   IntraVENous Continuous Sobhanie, Mohammad-Safa E, MD        lactated ringers  infusion   IntraVENous Continuous Sobhanie, Mohammad-Safa E, MD 125 mL/hr at 11/05/24 0633 New Bag at 11/05/24 9366    sodium chloride  flush 0.9 % injection 5-40 mL  5-40 mL IntraVENous 2 times per day Sobhanie, Mohammad-Safa E, MD        sodium chloride  flush 0.9 % injection 5-40 mL  5-40 mL IntraVENous PRN Sobhanie, Mohammad-Safa E, MD        0.9 % sodium chloride  infusion   IntraVENous PRN Sobhanie,  Mohammad-Safa E, MD        ceFAZolin  (ANCEF ) 2000 mg in sterile water  20 mL IV syringe  2,000 mg IntraVENous Once Black, Cordella LABOR, DPM           Allergies:    Allergies   Allergen Reactions    Vraylar [Cariprazine] Dizziness or Vertigo       Problem List:  There is no problem list on file for this patient.      Past Medical History:        Diagnosis Date    Arthritis     Asthma     Bipolar 1 disorder (HCC)     Diabetes mellitus (HCC)     [facility]    Hyperlipidemia     Hypertension     OSA (obstructive sleep apnea)     BPAP    Thyroid nodule     monitoring       Past Surgical History:        Procedure Laterality Date    ELBOW SURGERY      replacement    FOOT SURGERY Left     TONSILLECTOMY      UMBILICAL HERNIA REPAIR         Social History:    Social History     Tobacco Use    Smoking status: Never    Smokeless tobacco: Never   Substance Use Topics    Alcohol use: Not Currently                                Counseling given: Not Answered      Vital  Signs (Current):   Vitals:    11/05/24 0559 11/05/24 0602   BP:  (!) 149/71   Pulse:  81   Resp:  18   Temp:  97.2 F (36.2 C)   TempSrc: Temporal Temporal   SpO2:  96%   Weight: 107.5 kg (237 lb)    Height: 1.727 m (5' 8)                                               BP Readings from Last 3 Encounters:   11/05/24 (!) 149/71   10/27/24 119/70   12/30/23 (!) 145/59       NPO Status: Time of last liquid consumption: 2100                        Time of last solid consumption: 2100                        Date of last liquid consumption: 11/04/24                        Date of last solid food consumption: 11/04/24    BMI:   Wt Readings from Last 3 Encounters:   11/05/24 107.5 kg (237 lb)   10/27/24 106.1 kg (234 lb)   12/30/23 106.6 kg (235 lb)     Body mass index is 36.04 kg/m.    CBC:   Lab Results   Component Value Date/Time    WBC 6.3 10/27/2024 03:23 PM    RBC 4.55 10/27/2024 03:23 PM    HGB 14.5 10/27/2024 03:23 PM  HCT 42.1 10/27/2024 03:23 PM    MCV 92.5 10/27/2024 03:23 PM    RDW 13.8 10/27/2024 03:23 PM    PLT 212 10/27/2024 03:23 PM       CMP:   Lab Results   Component Value Date/Time    NA 139 10/27/2024 03:23 PM    K 4.1 10/27/2024 03:23 PM    CL 101 10/27/2024 03:23 PM    CO2 30 10/27/2024 03:23 PM    BUN 11 10/27/2024 03:23 PM    CREATININE 0.8 10/27/2024 03:23 PM    LABGLOM >90 10/27/2024 03:23 PM    GLUCOSE 192 10/27/2024 03:23 PM    CALCIUM 9.4 10/27/2024 03:23 PM    BILITOT 1.1 12/30/2023 09:01 AM    ALKPHOS 118 12/30/2023 09:01 AM    AST 25 12/30/2023 09:01 AM    ALT 43 12/30/2023 09:01 AM       POC Tests:   Recent Labs     11/05/24  0637   POCGLU 265*       Coags: No results found for: PROTIME, INR, APTT    HCG (If Applicable): No results found for: PREGTESTUR, PREGSERUM, HCG, HCGQUANT     ABGs: No results found for: PHART, PO2ART, PCO2ART, HCO3ART, BEART, O2SATART     Type & Screen (If Applicable):  No results found for: ABORH, LABANTI    Drug/Infectious Status (If  Applicable):  No results found for: HIV, HEPCAB    COVID-19 Screening (If Applicable):   Lab Results   Component Value Date/Time    COVID19 Not Detected 12/30/2023 10:28 AM           Anesthesia Evaluation    Patient summary reviewed and Nursing notes reviewed     no history of anesthetic complications:  Airway:  Mallampati: II  TM distance: >3 FB   Neck ROM: full    Mouth opening: > = 3 FB   Dental:           Pulmonary:       (+)       asthma:        sleep apnea: on BiPAP                        Cardiovascular:     Exercise tolerance: no interval change    (+)     hypertension:                                        (-) pacemaker, past MI and  angina        Rate: normal                Neuro/Psych:    (+)           psychiatric history:                 GI/Hepatic/Renal:            Endo/Other:     (+) Diabetes:                        Abdominal:              Vascular:         Other Findings:            Anesthesia Plan      general and TIVA  ASA 3       Induction: intravenous.    MIPS: Postoperative opioids intended and Prophylactic antiemetics administered.  Anesthetic plan and risks discussed with patient.      Plan discussed with CRNA.                  Delinda LELON Essex, DO   11/05/2024            "

## 2024-11-05 NOTE — H&P (Signed)
 "History and Physical Service   [facility]    HISTORY AND PHYSICAL EXAMINATION            Date of Evaluation: 11/05/2024  Patient name:  Daniel Hensley  MRN:   [id number]  Date of Birth:  09/21/1966  PCP:    Daniel Eliezer LOISE, MD    History Obtained From:     Patient, medical records    History of Present Illness:     This is Daniel Hensley a 58 y.o. male who presents today for a LEFT FOOT 1ST MPJ REVISION ARTHRODESIS by Netta Cordella LABOR, DPM for Pseudarthrosis after fusion or arthrodesis. Patient's chief complaint is left foot discomfort that he describes as poking. States he gets cramping in the left foot all the way up the calf. Reports nothing makes the pain worse it just always hurts. He is taking baclofen, which he states does help to alleviate some of the pain. Did have previous foot surgery in 2023. Denies fever, chills, shortness of breath, cough, congestion, wheezing, chest pain, open sores or wounds. Known diabetes, POC BS 265. Denies any current blood thinning medications.     Past Medical History:     Past Medical History:   Diagnosis Date    Arthritis     Asthma     Bipolar 1 disorder (HCC)     Diabetes mellitus (HCC)     [facility]    Hyperlipidemia     Hypertension     OSA (obstructive sleep apnea)     BPAP    Thyroid nodule     monitoring        Past Surgical History:     Past Surgical History:   Procedure Laterality Date    ELBOW SURGERY      replacement    FOOT SURGERY Left     TONSILLECTOMY      UMBILICAL HERNIA REPAIR          Medications Prior to Admission:     Prior to Admission medications   Medication Sig Start Date End Date Taking? Authorizing Provider   Lumateperone Tosylate (CAPLYTA) 21 MG capsule Take 1 capsule by mouth daily   Yes [provider]   amLODIPine (NORVASC) 5 MG tablet Take 1 tablet by mouth daily   Yes [provider]   naproxen (NAPROSYN) 500 MG tablet Take 1 tablet by mouth daily   Yes [provider]    omeprazole (PRILOSEC) 20 MG delayed release capsule Take 1 capsule by mouth daily   Yes [provider]   potassium chloride (MICRO-K) 10 MEQ extended release capsule Take 1 capsule by mouth daily   Yes [provider]   escitalopram (LEXAPRO) 10 MG tablet Take 1 tablet by mouth daily   Yes [provider]   Baclofen (LIORESAL) 5 MG tablet Take 1 tablet by mouth daily 10/15/24  Yes [provider]   Vitamin D3 125 MCG (5000 UT) TABS tablet Take 1 tablet by mouth daily   Yes [provider]   albuterol  sulfate HFA (VENTOLIN  HFA) 108 (90 Base) MCG/ACT inhaler Inhale 2 puffs into the lungs 4 times daily as needed for Wheezing 12/30/23  Yes Chryl Krabbe, DO   hydrOXYzine HCl (ATARAX) 10 MG tablet Take 1 tablet by mouth every 8 hours as needed 02/06/23  Yes [provider]   emtricitabine-tenofovir (TRUVADA) 200-300 MG per tablet Take 1 tablet by mouth daily 04/09/22  Yes [provider]   atorvastatin (LIPITOR) 40 MG tablet Take 1 tablet by mouth daily 02/06/23  Yes [provider]   Semaglutide,0.25 or 0.5MG /DOS, (OZEMPIC, 0.25 OR 0.5 MG/DOSE,) 2 MG/3ML SOPN Inject 0.5 mg into the skin once a week "Sundays 02/22/23   [provider]   Asenapine (SECUADO) 5.7 MG/24HR PT24 Apply 1 patch topically daily  Patient not taking: Reported on 10/27/2024 01/26/22   [provider]        Allergies:     Vraylar [cariprazine]    Social History:     Tobacco:    reports that he has never smoked. He has never used smokeless tobacco.  Alcohol:      reports that he does not currently use alcohol.  Drug Use:  reports no history of drug use.    Family History:     History reviewed. No pertinent family history.    Review of Systems:     Positive and Negative as described in HPI.    Review of Systems   Constitutional:  Positive for activity change. Negative for appetite change, chills, fatigue, fever and unexpected weight change.   HENT:  Negative for  congestion, hearing loss, rhinorrhea and sore throat.    Eyes: Negative.    Respiratory:  Positive for apnea (sleep apnea - wears BiPAP). Negative for cough, chest tightness, shortness of breath and wheezing.    Cardiovascular:  Negative for chest pain, palpitations and leg swelling.   Gastrointestinal:  Negative for abdominal distention, abdominal pain, constipation, diarrhea, nausea and vomiting.   Endocrine: Negative.    Genitourinary:  Negative for difficulty urinating, dysuria, frequency and urgency.   Musculoskeletal:         See HPI   Skin:  Negative for rash and wound.   Neurological:  Positive for numbness (bilateral feet). Negative for dizziness, weakness, light-headedness and headaches.   Hematological:  Negative for adenopathy. Does not bruise/bleed easily.   Psychiatric/Behavioral:  Positive for sleep disturbance (due to the pain). Negative for confusion and dysphoric mood. The patient is not nervous/anxious.       Physical Exam:   BP (!) 149/71   Pulse 81   Temp 97.2 F (36.2 C) (Temporal)   Resp 18   Ht 1.727 m (5' 8)   Wt 107.5 kg (237 lb)   SpO2 96%   BMI 36.04 kg/m   No LMP for male patient.  No obstetric history on file.  Recent Labs     11" /06/25  0637   POCGLU 265*     Physical Exam  Constitutional:       Appearance: Normal appearance. He is obese.   HENT:      Head: Normocephalic and atraumatic.      Right Ear: External ear normal.      Left Ear: External ear normal.      Nose: Nose normal. No congestion or rhinorrhea.      Mouth/Throat:      Mouth: Mucous membranes are moist.      Pharynx: Oropharynx is clear.   Eyes:      Conjunctiva/sclera: Conjunctivae normal.   Cardiovascular:      Rate and Rhythm: Normal rate and regular rhythm.      Pulses: Normal pulses.      Heart sounds: Normal heart sounds. No murmur heard.     No friction rub. No gallop.   Pulmonary:      Effort: Pulmonary effort is normal.      Breath sounds: Normal  breath sounds. No wheezing, rhonchi or rales.    Abdominal:      General: Bowel sounds are normal. There is no distension.      Palpations: Abdomen is soft.      Tenderness: There is no abdominal tenderness.   Musculoskeletal:         General: No swelling.      Cervical back: Normal range of motion and neck supple.      Right lower leg: No edema.      Left lower leg: No edema.      Left foot: Decreased range of motion (1st MTP). Tenderness present.   Skin:     General: Skin is warm and dry.      Findings: No bruising, lesion or rash.   Neurological:      Mental Status: He is alert and oriented to person, place, and time. Mental status is at baseline.      Motor: No weakness.      Gait: Gait normal.   Psychiatric:         Mood and Affect: Mood normal.         Behavior: Behavior normal.         Thought Content: Thought content normal.         Judgment: Judgment normal.        Investigations:      Laboratory Testing:  Recent Results (from the past 24 hours)   POC Glucose Fingerstick    Collection Time: 11/05/24  6:37 AM   Result Value Ref Range    POC Glucose 265 (H) 75 - 110 mg/dL     Recent Labs     89/71/74  1523   HGB 14.5   HCT 42.1   WBC 6.3   MCV 92.5   NA 139   K 4.1   CL 101   CO2 30   BUN 11   CREATININE 0.8   GLUCOSE 192*     No results for input(s): COVID19 in the last 720 hours.  Imaging/Diagnostics:    No results found.    Diagnosis:      Pseudarthrosis after fusion or arthrodesis    Plans:     1. LEFT FOOT 1ST MPJ REVISION ARTHRODESIS    Almetta Elbe, APRN - CNP  11/05/2024  7:07 AM  "

## 2024-11-05 NOTE — Discharge Instr - Activity (Signed)
 "Foot and Ankle Surgery Post Operative Instructions:  You have had a surgical procedure on your left foot    Fluids and Diet:  Begin with clear liquids, broth, dry toast, and crackers.  If not nauseated then resume your regular pre-operative diet when you are ready  With your history of diabetes, please lower your intake sugar content food as this will negatively impact surgery.    Medications:  Take your prescriptions as directed  You are receiving new prescriptions for Norco; take as directed.  You received nerve block to the foot-this should manage your pain for the first 18hrs post operatively  If your pain is not severe then you may take the non-prescription medication that you normally take for aches and pains ie Tylenol  and Ibuprofen (alternating), or if severe pain occurs these will serve as additional medication   You may resume your regularly scheduled medications (unless otherwise directed)  If you have a fracture or a surgery that involves placing hardware (screws, plates, joint replacement), avoid the routine use of NSAIDs for about 6 months if possible (due to a risk of delayed bone healing).  If any side effects or adverse reactions occur, discontinue the medication and contact your doctor.  Review the patient drug information that is provided before you take any medication    Ambulation and Activity:  You are advised to go directly home from the hospital  Use assistance device with either crutches, walker, or knee scooter  We recommend knee scooter if possible, you can also obtain these on [employer] for rather affordable and quickly obtainable.  You may put weight on the operated foot.  You should wear the surgical shoe at all times when awake.   Avoid stairs.  Do not lift or move heavy objects  Do not drive until cleared by your physician    Bandage and Wound Care Instructions:  Keep bandage clean and dry  Do NOT remove dressing/ splint  DO NOT get wet  Please use shower cover around leg if you do  shower so that dressing does not get wet  Do not shower or bathe the operative extremity  Do not remove the bandage (unless otherwise directed)   Do not attempt to put anything between the cast or dressing and your skin, some itching is normal.    Ice and Elevation:   Elevate operative extremity as much as possible to reduce swelling and discomfort.  Elevate with 2 pillows at or above the level of the heart for the first 72 hours. This is important for post operative swelling and pain  Ice:  Apply Hospital dispensed insulated ice bag behind the knee and atop the bandage 20 minutes of every hour while awake for the first 72 hours.  You may only ice behind the knee as well.    Special Instructions: Call your doctor immediately if you develop any of the following.  Fever over 100.4 degrees Fahrenheit by mouth - take your temperature daily until your first follow up visit.  Pain not relieved by medication ordered  Swelling, increased redness, warmth, or hardness around operative area.  Numb, tingling or cold toes.  Toe(s) become white or bluish  Bandage becomes wet, soiled, or blood soaked (small amount of bleeding may be normal)  Increased or progressive drainage from surgical area.    Follow up instructions:  You will need to follow up with Dr. Netta, Cordella LABOR, DPM within 7-10 days post operatively  Call when you get home to schedule or  confirm your appointment.  Call your surgeons office if you have any questions or concerns.  "

## 2024-11-16 NOTE — Telephone Encounter (Signed)
"  Jinnie with [facility] called to see if patient had been schedule from referral sent . Please reach out to patient      Thank you     "

## 2024-11-23 NOTE — Telephone Encounter (Signed)
"  Diagnosis   Z12.11 (ICD-10-CM) - Encounter for screening for malignant neoplasm of colon     "

## 2024-11-23 NOTE — Telephone Encounter (Signed)
"  LVM for the patient to call the office.  First attempt.  Mailing colon packet as a second.  "
